# Patient Record
Sex: Female | Born: 1960 | Race: White | Hispanic: No | Smoking: Never smoker
Health system: Southern US, Community
[De-identification: ages and names within clinical notes are randomized; demographics above are authoritative.]

## PROBLEM LIST (undated history)

## (undated) DIAGNOSIS — Z789 Other specified health status: Secondary | ICD-10-CM

## (undated) DIAGNOSIS — E538 Deficiency of other specified B group vitamins: Secondary | ICD-10-CM

## (undated) DIAGNOSIS — I10 Essential (primary) hypertension: Secondary | ICD-10-CM

## (undated) DIAGNOSIS — B009 Herpesviral infection, unspecified: Secondary | ICD-10-CM

## (undated) HISTORY — DX: Herpesviral infection, unspecified: B00.9

## (undated) HISTORY — DX: Deficiency of other specified B group vitamins: E53.8

---

## 1999-04-26 ENCOUNTER — Encounter: Payer: Self-pay | Admitting: Emergency Medicine

## 1999-04-26 ENCOUNTER — Emergency Department (HOSPITAL_COMMUNITY): Admission: EM | Admit: 1999-04-26 | Discharge: 1999-04-26 | Payer: Self-pay | Admitting: Emergency Medicine

## 2001-12-09 ENCOUNTER — Other Ambulatory Visit: Admission: RE | Admit: 2001-12-09 | Discharge: 2001-12-09 | Payer: Self-pay | Admitting: Obstetrics and Gynecology

## 2002-07-29 ENCOUNTER — Emergency Department (HOSPITAL_COMMUNITY): Admission: EM | Admit: 2002-07-29 | Discharge: 2002-07-29 | Payer: Self-pay | Admitting: *Deleted

## 2003-01-20 ENCOUNTER — Other Ambulatory Visit: Admission: RE | Admit: 2003-01-20 | Discharge: 2003-01-20 | Payer: Self-pay | Admitting: Obstetrics and Gynecology

## 2004-02-27 ENCOUNTER — Other Ambulatory Visit: Admission: RE | Admit: 2004-02-27 | Discharge: 2004-02-27 | Payer: Self-pay | Admitting: Obstetrics and Gynecology

## 2005-05-09 ENCOUNTER — Other Ambulatory Visit: Admission: RE | Admit: 2005-05-09 | Discharge: 2005-05-09 | Payer: Self-pay | Admitting: Obstetrics and Gynecology

## 2005-09-28 ENCOUNTER — Emergency Department (HOSPITAL_COMMUNITY): Admission: EM | Admit: 2005-09-28 | Discharge: 2005-09-28 | Payer: Self-pay | Admitting: Emergency Medicine

## 2006-07-09 ENCOUNTER — Other Ambulatory Visit: Admission: RE | Admit: 2006-07-09 | Discharge: 2006-07-09 | Payer: Self-pay | Admitting: Obstetrics and Gynecology

## 2007-02-06 ENCOUNTER — Encounter (INDEPENDENT_AMBULATORY_CARE_PROVIDER_SITE_OTHER): Payer: Self-pay | Admitting: Specialist

## 2007-02-06 ENCOUNTER — Ambulatory Visit (HOSPITAL_COMMUNITY): Admission: RE | Admit: 2007-02-06 | Discharge: 2007-02-06 | Payer: Self-pay | Admitting: Obstetrics and Gynecology

## 2009-07-05 ENCOUNTER — Inpatient Hospital Stay (HOSPITAL_COMMUNITY): Admission: AD | Admit: 2009-07-05 | Discharge: 2009-07-05 | Payer: Self-pay | Admitting: Obstetrics and Gynecology

## 2010-03-01 ENCOUNTER — Observation Stay (HOSPITAL_COMMUNITY): Admission: EM | Admit: 2010-03-01 | Discharge: 2010-03-01 | Payer: Self-pay | Admitting: Internal Medicine

## 2011-01-11 LAB — COMPREHENSIVE METABOLIC PANEL
ALT: 13 U/L (ref 0–35)
AST: 19 U/L (ref 0–37)
Albumin: 3.4 g/dL — ABNORMAL LOW (ref 3.5–5.2)
Alkaline Phosphatase: 44 U/L (ref 39–117)
BUN: 8 mg/dL (ref 6–23)
CO2: 30 mEq/L (ref 19–32)
Calcium: 8.9 mg/dL (ref 8.4–10.5)
Chloride: 103 mEq/L (ref 96–112)
Creatinine, Ser: 0.71 mg/dL (ref 0.4–1.2)
GFR calc Af Amer: >60 mL/min (ref >60)
GFR calc non Af Amer: >60 mL/min (ref >60)
Glucose, Bld: 99 mg/dL (ref 70–99)
Potassium: 3.9 mEq/L (ref 3.5–5.1)
Sodium: 138 mEq/L (ref 135–145)
Total Bilirubin: 0.2 mg/dL — ABNORMAL LOW (ref 0.3–1.2)
Total Protein: 6.3 g/dL (ref 6.0–8.3)

## 2011-01-11 LAB — URINALYSIS, ROUTINE W REFLEX MICROSCOPIC
Bilirubin Urine: NEGATIVE
Glucose, UA: NEGATIVE mg/dL
Ketones, ur: NEGATIVE mg/dL
Leukocytes, UA: NEGATIVE
Nitrite: NEGATIVE
Protein, ur: NEGATIVE mg/dL
Specific Gravity, Urine: 1.01 (ref 1.005–1.030)
Urobilinogen, UA: 0.2 mg/dL (ref 0.0–1.0)
pH: 7.5 (ref 5.0–8.0)

## 2011-01-11 LAB — CBC
HCT: 28.5 % — ABNORMAL LOW (ref 36.0–46.0)
Hemoglobin: 9.2 g/dL — ABNORMAL LOW (ref 12.0–15.0)
MCHC: 32.3 g/dL (ref 30.0–36.0)
MCV: 75.8 fL — ABNORMAL LOW (ref 78.0–100.0)
Platelets: 313 10*3/uL (ref 150–400)
RBC: 3.76 MIL/uL — ABNORMAL LOW (ref 3.87–5.11)
RDW: 14.6 % (ref 11.5–15.5)
WBC: 6.3 10*3/uL (ref 4.0–10.5)

## 2011-01-11 LAB — URINE MICROSCOPIC-ADD ON

## 2011-02-22 NOTE — Op Note (Signed)
NAMESAKARA, LEHTINEN              ACCOUNT NO.:  [id number]   MEDICAL RECORD NO.:  [id number]          PATIENT TYPE:  AMB   LOCATION:  SDC                           FACILITY:  [facility]   PHYSICIAN:  Huel Cote, M.D. DATE OF BIRTH:  1960-11-22   DATE OF PROCEDURE:  02/06/2007  DATE OF DISCHARGE:                               OPERATIVE REPORT   PREOPERATIVE DIAGNOSES:  1. Abnormal uterine bleeding.  2. Menorrhagia.   POSTOPERATIVE DIAGNOSES:  1. Abnormal uterine bleeding.  2. Menorrhagia.  3. Submucous fibroid identified.   PROCEDURE:  1. Dilatation and curettage.  2. Operative hysteroscopy with partial resection of submucosal      fibroid.  3. Paracervical block.   SURGEON:  Huel Cote, M.D.   ANESTHESIA:  LMA and local 1% lidocaine plain.   SPECIMENS:  Endometrial curettings and fragments of fibroid were sent.   ESTIMATED BLOOD LOSS:  50 mL.   HYSTEROSCOPIC DEFICIT:  Estimated at 500 mL.   IV FLUIDS:  1200 mL LR.   PROCEDURE:  The patient was taken to the operating room, where LMA  anesthesia was obtained without difficulty.  She was then prepped and  draped in the normal sterile fashion in the dorsal lithotomy position.  A speculum was then placed within the vagina, the cervix identified and  injected with a local paracervical block at the 2 and 10 o'clock  position with 1% lidocaine to help with postoperative pain.  The cervix  was identified and the uterus sounded to approximately 8-9 cm.  The  cervix was partially dilated from her prior treatment with Cytotec and  was easily sequentially dilated with the [facility] dilators to approximately  27.  The small resectoscope was then introduced into the cervix without  difficulty and the uterine cavity inspected.  There was some adherent  blood clot which was removed, and at this point it was noted that the  endometrium itself appeared thin with a submucosal fibroid noted on the  posterior surface of the uterus.   There was no polyp identified and,  again, endometrium did not appear especially thick.  The nature of the  fibroid on the posterior wall was very large, flat, broad-based fibroid,  which was a little difficult to identify the base of.  Therefore, a very  conservative resection was performed with the single loop cautery and  just the areas that were clearly above the endometrium were resected  that was located at the junction of the cervix and the fundus itself;  therefore, attention was made to be very conservative  in the passes.  Of note, the hysteroscopic deficit was estimated at 500 mL.  Prior to  the procedure the equipment was not hooked up correctly and  approximately 140 mL were lost on the floor.  When this was subtracted  from the deficit, which at the conclusion of the procedure read 875 in  addition to another at least 100 mL on the towels on the floor, it was  estimated that the hysteroscopic deficit could not have exceeded 500 mL,  and there was no evidence at any point of  any perforation with the  uterus nicely inflated.  The deficit, as stated, was approximately 875  at the conclusion of the procedure and given this rising deficit and  difficulty in visualizing the base of the fibroid, it was determined to  discontinue any further resection.  The hysteroscope was removed and the  cervix examined.  When the tenaculum was removed, one area was treated  with silver nitrate for good result and at this point there was no  significant active bleeding noted.  The patient was then awakened and  taken to the recovery room in stable condition.      Huel Cote, M.D.  Electronically Signed     KR/MEDQ  D:  02/06/2007  T:  02/06/2007  Job:  [id number]

## 2011-02-22 NOTE — H&P (Signed)
Brooke Wu, CASE              ACCOUNT NO.:  [id number]   MEDICAL RECORD NO.:  [id number]          PATIENT TYPE:  AMB   LOCATION:  SDC                           FACILITY:  [facility]   PHYSICIAN:  Huel Cote, M.D. DATE OF BIRTH:  09-29-1961   DATE OF ADMISSION:  02/06/2007  DATE OF DISCHARGE:                              HISTORY & PHYSICAL   PREOPERATIVE HISTORY AND PHYSICAL   The patient is a 50 year old, G2, P2, who is coming in for a scheduled  hysteroscopy, a possible polypectomy, and possible resection of a  submucosal fibroid due to a problem with abnormal uterine bleeding with  a cycle that went on for approximately eight weeks with some heavy days  with large, large clots passed.  Patient had been placed on a Dose-Pak  Femcon and has had some slowing of her bleeding.  She had a saline  infusion ultrasound performed, which revealed a possible polyp as well  as several small fibroids in the uterus.  Although her bleeding had  improved with Femcon, given her abnormal bleeding it was felt that it  would be most prudent to perform endometrial sampling and possible  polypectomy simultaneously in the OR with hysteroscopy guidance, and the  patient is agreeable to this plan.   PAST MEDICAL HISTORY:  Significant for migraines.   PAST SURGICAL HISTORY:  None.   PAST OBSTETRIC HISTORY:  Two vaginal deliveries.   PAST GYNECOLOGIC HISTORY:  No abnormal Pap smears.   There is no breast cancer or colon cancer in the family.   CURRENT MEDICATIONS:  1. Femcon oral contraceptives 50 mg.  2. Xanax p.r.n.   PHYSICAL EXAMINATION:  VITAL SIGNS:  Her weight is 118 pounds, blood  pressure 110/70.  CARDIAC:  Regular rate and rhythm.  LUNGS:  Clear.  ABDOMEN:  Soft and nontender.  PELVIC EXAM:  She has a normal size uterus, and her adnexa have no  palpable masses.   We discussed multiple possibilities including conservative management  with an endometrial biopsy, however, given the  possible presence of a  polyp, I have suggested that the patient proceed with hysteroscopy to  remove this polyp and correct her bleeding problems and also ensure no  endometrial atypia is present.  The risks and benefits were discussed in  detail including bleeding and infection and possible uterine  perforation.  The patient understands all  these risks and desires to proceed with the surgery as stated.  She will  utilize Cytotec 400 mcg in the vagina three to four hours prior to  procedure, and we will proceed as planned at 7:30 a.m. at [facility] facility on May 2nd.      Huel Cote, M.D.  Electronically Signed     KR/MEDQ  D:  02/05/2007  T:  02/05/2007  Job:  [id number]

## 2012-10-31 ENCOUNTER — Emergency Department (HOSPITAL_COMMUNITY)
Admission: EM | Admit: 2012-10-31 | Discharge: 2012-11-01 | Disposition: A | Discharge status: Home / Self Care | Payer: BC Managed Care – PPO | Attending: Emergency Medicine | Admitting: Emergency Medicine

## 2012-10-31 DIAGNOSIS — I10 Essential (primary) hypertension: Secondary | ICD-10-CM | POA: Insufficient documentation

## 2012-10-31 DIAGNOSIS — X500XXA Overexertion from strenuous movement or load, initial encounter: Secondary | ICD-10-CM | POA: Insufficient documentation

## 2012-10-31 DIAGNOSIS — Y929 Unspecified place or not applicable: Secondary | ICD-10-CM | POA: Insufficient documentation

## 2012-10-31 DIAGNOSIS — S82853A Displaced trimalleolar fracture of unspecified lower leg, initial encounter for closed fracture: Secondary | ICD-10-CM

## 2012-10-31 DIAGNOSIS — Z79899 Other long term (current) drug therapy: Secondary | ICD-10-CM | POA: Insufficient documentation

## 2012-10-31 DIAGNOSIS — Y9389 Activity, other specified: Secondary | ICD-10-CM | POA: Insufficient documentation

## 2012-10-31 HISTORY — DX: Essential (primary) hypertension: I10

## 2012-10-31 MED ORDER — OXYCODONE-ACETAMINOPHEN 5-325 MG PO TABS
1.0000 | ORAL_TABLET | Freq: Once | ORAL | Status: AC
  Administered 2012-11-01: 1 via ORAL
  Filled 2012-10-31: qty 1

## 2012-10-31 NOTE — ED Notes (Signed)
Pt says she was going down the steps, missed the last step, injuring her ankle. She says she heard a popping in her left ankle.

## 2012-10-31 NOTE — ED Provider Notes (Signed)
History   This chart was scribed for non-physician practitioner working with Brooke Nielsen, MD by Smitty Pluck. This patient was seen in room WTR7/WTR7 and the patient's care was started at 11:55 PM.   CSN: [id number]  Arrival date & time 10/31/12  2343    Chief Complaint  Patient presents with  . Ankle Pain     Patient is a 52 y.o. female presenting with ankle pain. The history is provided by the patient. No language interpreter was used.  Ankle Pain  Pertinent negatives include no numbness.   Brooke Wu is a 52 y.o. female who presents to the Emergency Department complaining of constant, moderate left ankle pain onset today. Pt reports that she was walking down steps and missed the last step causing her to roll her ankle. Patient is nonweightbearing secondary to pain. She has had single call drink tonight. Pt denies numbness, fever, nausea, vomiting, diarrhea, cough, SOB and any other pain.   No past medical history on file.  No past surgical history on file.  No family history on file.  History  Substance Use Topics  . Smoking status: Not on file  . Smokeless tobacco: Not on file  . Alcohol Use: Not on file    OB History    No data available      Review of Systems  Constitutional: Negative for fever.  Respiratory: Negative for shortness of breath.   Cardiovascular: Negative for chest pain.  Gastrointestinal: Negative for nausea, vomiting, abdominal pain and diarrhea.  Musculoskeletal: Positive for arthralgias.  Neurological: Negative for numbness.  All other systems reviewed and are negative.    Allergies  Review of patient's allergies indicates not on file.  Home Medications  No current outpatient prescriptions on file.  BP 166/81  Pulse 73  Temp 98.9 F (37.2 C)  Resp 16  SpO2 99%  Physical Exam  Nursing note and vitals reviewed. Constitutional: She is oriented to person, place, and time. She appears well-developed and well-nourished. No  distress.  HENT:  Head: Normocephalic.  Eyes: Conjunctivae normal and EOM are normal.  Cardiovascular: Normal rate.   Pulmonary/Chest: Effort normal. No stridor.  Musculoskeletal:       Mild tenderness to palpation of distal tibia  Trace swelling   DP +2 bilaterally Distal sensation intact  Patient is not able to weight-bear  Neurological: She is alert and oriented to person, place, and time.  Psychiatric: She has a normal mood and affect.    ED Course  Procedures (including critical care time) DIAGNOSTIC STUDIES: Oxygen Saturation is 99% on room air, normal by my interpretation.    COORDINATION OF CARE: 12:01 AM Discussed ED treatment with pt and pt agrees.     Labs Reviewed - No data to display Dg Ankle Complete Left  11/01/2012  *RADIOLOGY REPORT*  Clinical Data: Status post fall down steps; lateral ankle swelling and bruising.  Associated lateral and posterior ankle pain.  LEFT ANKLE COMPLETE - 3+ VIEW  Comparison: None.  Findings: There is a mildly displaced horizontal medial malleolar fracture, as well as a posteriorly displaced slightly comminuted oblique fracture through the distal fibula.  There is also a slightly comminuted posterior displaced fracture involving the posterior malleolus.  There is mild inferior and posterior subluxation of the talus.  The ankle mortise is difficult to assess given expansion of the joint space.  The interosseous space appears grossly intact.  The joint spaces are otherwise preserved.  Diffuse soft tissue swelling is noted about the  ankle.  IMPRESSION: Trimalleolar fracture noted, with slight comminution and posterior displacement at the distal fibular and posterior malleolar fractures.  The distal fibular fracture is oblique in nature.  Mild inferior and posterior subluxation of the talus.   Original Report Authenticated By: Tonia Ghent, M.D.      1. Trimalleolar fracture       MDM  trimalleolar fracture with Posteriorly displaced  fracture of the distal fibula.  Orthopedic consult from Dr. Shelle Iron appreciated: He is evaluated the x-ray and has advised [place] to attempt reduction and placed in a posterior splint and U-splint. Technique advised is one hand on front of tibia and pulled forward he states he subluxation should resolve spontaneously with this pressure.   Waiting for ortho tech to arrive before attempting reduction by Dr. Dierdre Highman.   New Prescriptions   OXYCODONE-ACETAMINOPHEN (PERCOCET/ROXICET) 5-325 MG PER TABLET    1 to 2 tabs PO q6hrs  PRN for pain    I personally performed the services described in this documentation, which was scribed in my presence. The recorded information has been reviewed and is accurate.   Wynetta Emery, PA-C 11/01/12 [phone]

## 2012-11-01 ENCOUNTER — Emergency Department (HOSPITAL_COMMUNITY): Payer: BC Managed Care – PPO

## 2012-11-01 ENCOUNTER — Encounter (HOSPITAL_COMMUNITY): Payer: Self-pay | Admitting: *Deleted

## 2012-11-01 MED ORDER — HYDROMORPHONE HCL PF 1 MG/ML IJ SOLN
1.0000 mg | Freq: Once | INTRAMUSCULAR | Status: AC
  Administered 2012-11-01: 1 mg via INTRAVENOUS
  Filled 2012-11-01: qty 1

## 2012-11-01 MED ORDER — ONDANSETRON HCL 4 MG/2ML IJ SOLN
4.0000 mg | Freq: Once | INTRAMUSCULAR | Status: AC
  Administered 2012-11-01: 4 mg via INTRAVENOUS
  Filled 2012-11-01: qty 2

## 2012-11-01 MED ORDER — SODIUM CHLORIDE 0.9 % IV BOLUS (SEPSIS)
1000.0000 mL | Freq: Once | INTRAVENOUS | Status: AC
  Administered 2012-11-01: 1000 mL via INTRAVENOUS

## 2012-11-01 MED ORDER — OXYCODONE-ACETAMINOPHEN 5-325 MG PO TABS: ORAL_TABLET | ORAL | Status: DC

## 2012-11-01 NOTE — ED Provider Notes (Signed)
Medical screening examination/treatment/procedure(s) were conducted as a shared visit with non-physician practitioner(s) and myself.  I personally evaluated the patient during the encounter  L ankle pain deformity and TTP with distal N/V intact. Xray reviewed. I assisted ortho tech in splinting s/p reduction. Post reduction films obtained. Plan crutches, NWB, f/u Ortho Dr Shelle Iron.   3:41 AM PROCEDURE - Splinting. Consent obtained, time out performed. IV Dilaudid.  Traction applied.  splinting performed.  PT tolerated well, post procedure films obtained. PT tolerated well.   [name], MD 11/01/12 [phone]

## 2012-11-01 NOTE — ED Notes (Addendum)
PA at bedside to discuss plan of care

## 2012-11-01 NOTE — ED Notes (Signed)
Patient transported to X-ray 

## 2012-11-03 ENCOUNTER — Encounter (HOSPITAL_BASED_OUTPATIENT_CLINIC_OR_DEPARTMENT_OTHER): Payer: Self-pay | Admitting: *Deleted

## 2012-11-03 NOTE — Progress Notes (Signed)
To come in for bmet-called dr Oneta Rack for ekg and notes Has several body piercing-the one in tongue she will put a plastic filler in.

## 2012-11-04 ENCOUNTER — Other Ambulatory Visit: Payer: Self-pay | Admitting: Orthopedic Surgery

## 2012-11-05 ENCOUNTER — Ambulatory Visit (HOSPITAL_BASED_OUTPATIENT_CLINIC_OR_DEPARTMENT_OTHER)
Admission: RE | Admit: 2012-11-05 | Discharge: 2012-11-05 | Disposition: A | Discharge status: Home / Self Care | Payer: BC Managed Care – PPO | Source: Ambulatory Visit | Attending: Orthopedic Surgery | Admitting: Orthopedic Surgery

## 2012-11-05 ENCOUNTER — Ambulatory Visit (HOSPITAL_BASED_OUTPATIENT_CLINIC_OR_DEPARTMENT_OTHER): Payer: BC Managed Care – PPO | Admitting: Anesthesiology

## 2012-11-05 ENCOUNTER — Encounter (HOSPITAL_BASED_OUTPATIENT_CLINIC_OR_DEPARTMENT_OTHER)
Admission: RE | Disposition: A | Discharge status: Home / Self Care | Payer: Self-pay | Source: Ambulatory Visit | Attending: Orthopedic Surgery

## 2012-11-05 ENCOUNTER — Encounter (HOSPITAL_BASED_OUTPATIENT_CLINIC_OR_DEPARTMENT_OTHER): Payer: Self-pay | Admitting: Anesthesiology

## 2012-11-05 ENCOUNTER — Encounter (HOSPITAL_BASED_OUTPATIENT_CLINIC_OR_DEPARTMENT_OTHER): Payer: Self-pay | Admitting: *Deleted

## 2012-11-05 ENCOUNTER — Ambulatory Visit (HOSPITAL_COMMUNITY): Payer: BC Managed Care – PPO

## 2012-11-05 DIAGNOSIS — S82852A Displaced trimalleolar fracture of left lower leg, initial encounter for closed fracture: Secondary | ICD-10-CM

## 2012-11-05 DIAGNOSIS — I10 Essential (primary) hypertension: Secondary | ICD-10-CM | POA: Insufficient documentation

## 2012-11-05 DIAGNOSIS — X500XXA Overexertion from strenuous movement or load, initial encounter: Secondary | ICD-10-CM | POA: Insufficient documentation

## 2012-11-05 DIAGNOSIS — S82853A Displaced trimalleolar fracture of unspecified lower leg, initial encounter for closed fracture: Secondary | ICD-10-CM | POA: Insufficient documentation

## 2012-11-05 HISTORY — PX: ORIF ANKLE FRACTURE: SHX5408

## 2012-11-05 HISTORY — DX: Other specified health status: Z78.9

## 2012-11-05 LAB — POCT I-STAT, CHEM 8
HCT: 41 % (ref 36.0–46.0)
Hemoglobin: 13.9 g/dL (ref 12.0–15.0)
Potassium: 3.7 mEq/L (ref 3.5–5.1)
Sodium: 139 mEq/L (ref 135–145)
TCO2: 26 mmol/L (ref 0–100)

## 2012-11-05 LAB — POCT HEMOGLOBIN-HEMACUE: Hemoglobin: 14.5 g/dL (ref 12.0–15.0)

## 2012-11-05 SURGERY — OPEN REDUCTION INTERNAL FIXATION (ORIF) ANKLE FRACTURE
Anesthesia: Regional | Site: Ankle | Laterality: Left | Wound class: Clean

## 2012-11-05 MED ORDER — FENTANYL CITRATE 0.05 MG/ML IJ SOLN
50.0000 ug | INTRAMUSCULAR | Status: DC | PRN
  Administered 2012-11-05: 100 ug via INTRAVENOUS

## 2012-11-05 MED ORDER — HYDROMORPHONE HCL PF 1 MG/ML IJ SOLN
0.2500 mg | INTRAMUSCULAR | Status: DC | PRN
  Administered 2012-11-05 (×4): 0.5 mg via INTRAVENOUS

## 2012-11-05 MED ORDER — OXYCODONE HCL 5 MG PO TABS
5.0000 mg | ORAL_TABLET | ORAL | Status: DC | PRN
Start: 2012-11-05 — End: 2013-09-16

## 2012-11-05 MED ORDER — SUCCINYLCHOLINE CHLORIDE 20 MG/ML IJ SOLN
INTRAMUSCULAR | Status: DC | PRN
  Administered 2012-11-05: 80 mg via INTRAVENOUS

## 2012-11-05 MED ORDER — PROPOFOL 10 MG/ML IV BOLUS
INTRAVENOUS | Status: DC | PRN
  Administered 2012-11-05 (×2): 200 mg via INTRAVENOUS
  Administered 2012-11-05: 50 mg via INTRAVENOUS

## 2012-11-05 MED ORDER — MIDAZOLAM HCL 2 MG/2ML IJ SOLN: 1.0000 mg | INTRAMUSCULAR | Status: DC | PRN

## 2012-11-05 MED ORDER — FENTANYL CITRATE 0.05 MG/ML IJ SOLN: 50.0000 ug | INTRAMUSCULAR | Status: DC | PRN

## 2012-11-05 MED ORDER — DEXAMETHASONE SODIUM PHOSPHATE 4 MG/ML IJ SOLN
INTRAMUSCULAR | Status: DC | PRN
  Administered 2012-11-05: 10 mg via INTRAVENOUS

## 2012-11-05 MED ORDER — FENTANYL CITRATE 0.05 MG/ML IJ SOLN
INTRAMUSCULAR | Status: DC | PRN
  Administered 2012-11-05 (×2): 50 ug via INTRAVENOUS

## 2012-11-05 MED ORDER — CEFAZOLIN SODIUM-DEXTROSE 2-3 GM-% IV SOLR
2.0000 g | INTRAVENOUS | Status: AC
  Administered 2012-11-05: 2 g via INTRAVENOUS

## 2012-11-05 MED ORDER — LIDOCAINE HCL (CARDIAC) 20 MG/ML IV SOLN
INTRAVENOUS | Status: DC | PRN
  Administered 2012-11-05: 75 mg via INTRAVENOUS

## 2012-11-05 MED ORDER — MIDAZOLAM HCL 2 MG/2ML IJ SOLN
1.0000 mg | INTRAMUSCULAR | Status: DC | PRN
  Administered 2012-11-05: 2 mg via INTRAVENOUS

## 2012-11-05 MED ORDER — LACTATED RINGERS IV SOLN
INTRAVENOUS | Status: DC
  Administered 2012-11-05 (×2): via INTRAVENOUS

## 2012-11-05 MED ORDER — SODIUM CHLORIDE 0.9 % IV SOLN: INTRAVENOUS | Status: DC

## 2012-11-05 MED ORDER — EPHEDRINE SULFATE 50 MG/ML IJ SOLN
INTRAMUSCULAR | Status: DC | PRN
  Administered 2012-11-05: 5 mg via INTRAVENOUS
  Administered 2012-11-05: 10 mg via INTRAVENOUS

## 2012-11-05 MED ORDER — LACTATED RINGERS IV SOLN
INTRAVENOUS | Status: DC
  Administered 2012-11-05: 09:00:00 via INTRAVENOUS

## 2012-11-05 MED ORDER — CHLORHEXIDINE GLUCONATE 4 % EX LIQD: 60.0000 mL | Freq: Once | CUTANEOUS | Status: DC

## 2012-11-05 MED ORDER — OXYCODONE HCL 5 MG PO TABS
5.0000 mg | ORAL_TABLET | Freq: Once | ORAL | Status: AC
  Administered 2012-11-05: 5 mg via ORAL

## 2012-11-05 SURGICAL SUPPLY — 80 items
BANDAGE ESMARK 6X9 LF (GAUZE/BANDAGES/DRESSINGS) ×1 IMPLANT
BIT DRILL 2.5X2.75 QC CALB (BIT) ×1 IMPLANT
BIT DRILL 3.5X5.5 QC CALB (BIT) ×1 IMPLANT
BLADE SURG 15 STRL LF DISP TIS (BLADE) ×2 IMPLANT
BLADE SURG 15 STRL SS (BLADE) ×4
BNDG CMPR 9X4 STRL LF SNTH (GAUZE/BANDAGES/DRESSINGS)
BNDG CMPR 9X6 STRL LF SNTH (GAUZE/BANDAGES/DRESSINGS) ×1
BNDG COHESIVE 4X5 TAN STRL (GAUZE/BANDAGES/DRESSINGS) ×2 IMPLANT
BNDG COHESIVE 6X5 TAN STRL LF (GAUZE/BANDAGES/DRESSINGS) ×2 IMPLANT
BNDG ESMARK 4X9 LF (GAUZE/BANDAGES/DRESSINGS) IMPLANT
BNDG ESMARK 6X9 LF (GAUZE/BANDAGES/DRESSINGS) ×2
CHLORAPREP W/TINT 26ML (MISCELLANEOUS) ×2 IMPLANT
CLOTH BEACON ORANGE TIMEOUT ST (SAFETY) ×2 IMPLANT
COVER TABLE BACK 60X90 (DRAPES) ×2 IMPLANT
CUFF TOURNIQUET SINGLE 34IN LL (TOURNIQUET CUFF) ×2 IMPLANT
DECANTER SPIKE VIAL GLASS SM (MISCELLANEOUS) IMPLANT
DRAPE C-ARM 42X72 X-RAY (DRAPES) ×2 IMPLANT
DRAPE C-ARMOR (DRAPES) ×1 IMPLANT
DRAPE EXTREMITY T 121X128X90 (DRAPE) ×2 IMPLANT
DRAPE INCISE IOBAN 66X45 STRL (DRAPES) IMPLANT
DRAPE U-SHAPE 47X51 STRL (DRAPES) ×2 IMPLANT
DRAPE U-SHAPE 76X120 STRL (DRAPES) IMPLANT
DRSG ADAPTIC 3X8 NADH LF (GAUZE/BANDAGES/DRESSINGS) IMPLANT
DRSG EMULSION OIL 3X3 NADH (GAUZE/BANDAGES/DRESSINGS) ×2 IMPLANT
DRSG PAD ABDOMINAL 8X10 ST (GAUZE/BANDAGES/DRESSINGS) ×4 IMPLANT
ELECT REM PT RETURN 9FT ADLT (ELECTROSURGICAL) ×2
ELECTRODE REM PT RTRN 9FT ADLT (ELECTROSURGICAL) ×1 IMPLANT
GLOVE BIO SURGEON STRL SZ 6.5 (GLOVE) ×1 IMPLANT
GLOVE BIO SURGEON STRL SZ8 (GLOVE) ×2 IMPLANT
GLOVE BIOGEL PI IND STRL 8 (GLOVE) ×1 IMPLANT
GLOVE BIOGEL PI INDICATOR 8 (GLOVE) ×1
GOWN PREVENTION PLUS XLARGE (GOWN DISPOSABLE) ×2 IMPLANT
GOWN PREVENTION PLUS XXLARGE (GOWN DISPOSABLE) ×2 IMPLANT
K-WIRE ACE 1.6X6 (WIRE) ×2
KWIRE ACE 1.6X6 (WIRE) IMPLANT
NEEDLE HYPO 22GX1.5 SAFETY (NEEDLE) IMPLANT
NS IRRIG 1000ML POUR BTL (IV SOLUTION) ×2 IMPLANT
PACK BASIN DAY SURGERY FS (CUSTOM PROCEDURE TRAY) ×2 IMPLANT
PAD CAST 4YDX4 CTTN HI CHSV (CAST SUPPLIES) ×1 IMPLANT
PADDING CAST ABS 4INX4YD NS (CAST SUPPLIES)
PADDING CAST ABS COTTON 4X4 ST (CAST SUPPLIES) IMPLANT
PADDING CAST COTTON 4X4 STRL (CAST SUPPLIES) ×2
PADDING CAST COTTON 6X4 STRL (CAST SUPPLIES) ×2 IMPLANT
PENCIL BUTTON HOLSTER BLD 10FT (ELECTRODE) ×2 IMPLANT
PLATE ACE 100DEG 7HOLE (Plate) ×1 IMPLANT
SCREW ACE CAN 4.0 36M (Screw) ×1 IMPLANT
SCREW ACE CAN 4.0 48M (Screw) ×1 IMPLANT
SCREW ACE CAN 4.0 50M (Screw) ×1 IMPLANT
SCREW CANN ACE 4.0X32 (Screw) ×1 IMPLANT
SCREW CORTICAL 3.5MM  12MM (Screw) ×3 IMPLANT
SCREW CORTICAL 3.5MM  16MM (Screw) ×1 IMPLANT
SCREW CORTICAL 3.5MM 12MM (Screw) IMPLANT
SCREW CORTICAL 3.5MM 14MM (Screw) ×1 IMPLANT
SCREW CORTICAL 3.5MM 16MM (Screw) IMPLANT
SCREW CORTICAL 3.5MM 18MM (Screw) ×2 IMPLANT
SCREW CORTICAL 3.5MM 22MM (Screw) ×1 IMPLANT
SCREW CORTICAL 3.5MM 26MM (Screw) ×1 IMPLANT
SHEET MEDIUM DRAPE 40X70 STRL (DRAPES) ×2 IMPLANT
SLEEVE SCD COMPRESS KNEE MED (MISCELLANEOUS) ×2 IMPLANT
SPLINT FAST PLASTER 5X30 (CAST SUPPLIES) ×20
SPLINT PLASTER CAST FAST 5X30 (CAST SUPPLIES) ×20 IMPLANT
SPONGE GAUZE 4X4 12PLY (GAUZE/BANDAGES/DRESSINGS) ×2 IMPLANT
SPONGE LAP 18X18 X RAY DECT (DISPOSABLE) ×2 IMPLANT
STOCKINETTE 6  STRL (DRAPES) ×1
STOCKINETTE 6 STRL (DRAPES) ×1 IMPLANT
SUCTION FRAZIER TIP 10 FR DISP (SUCTIONS) ×2 IMPLANT
SUT ETHILON 3 0 PS 1 (SUTURE) ×2 IMPLANT
SUT FIBERWIRE #2 38 T-5 BLUE (SUTURE)
SUT MNCRL AB 3-0 PS2 18 (SUTURE) ×2 IMPLANT
SUT VIC AB 0 SH 27 (SUTURE) IMPLANT
SUT VIC AB 2-0 SH 27 (SUTURE) ×2
SUT VIC AB 2-0 SH 27XBRD (SUTURE) IMPLANT
SUT VICRYL 4-0 PS2 18IN ABS (SUTURE) IMPLANT
SUTURE FIBERWR #2 38 T-5 BLUE (SUTURE) IMPLANT
SYR BULB 3OZ (MISCELLANEOUS) ×2 IMPLANT
SYR CONTROL 10ML LL (SYRINGE) IMPLANT
TUBE CONNECTING 20X1/4 (TUBING) IMPLANT
UNDERPAD 30X30 INCONTINENT (UNDERPADS AND DIAPERS) ×2 IMPLANT
WASHER FLAT ACE (Orthopedic Implant) ×1 IMPLANT
WASHER PLAIN FLAT ACE NS 3PK (Orthopedic Implant) IMPLANT

## 2012-11-05 NOTE — Progress Notes (Signed)
Assisted Dr. Edwards with left, ultrasound guided, popliteal block. Side rails up, monitors on throughout procedure. See vital signs in flow sheet. Tolerated Procedure well. 

## 2012-11-05 NOTE — Anesthesia Postprocedure Evaluation (Signed)
  Anesthesia Post-op Note  Patient: Brooke Wu  Procedure(s) Performed: Procedure(s) (LRB) with comments: OPEN REDUCTION INTERNAL FIXATION (ORIF) ANKLE FRACTURE (Left) - open reduction internal fixation LEFT ANKLE TRIMALLEOLAR FRACTURE   Patient Location: PACU  Anesthesia Type:General  Level of Consciousness: awake  Airway and Oxygen Therapy: Patient Spontanous Breathing  Post-op Pain: mild  Post-op Assessment: Post-op Vital signs reviewed  Post-op Vital Signs: Reviewed  Complications: No apparent anesthesia complications

## 2012-11-05 NOTE — Anesthesia Procedure Notes (Addendum)
Procedure Name: Intubation Date/Time: 11/05/2012 10:48 AM Performed by: Gar Gibbon Pre-anesthesia Checklist: Patient identified, Emergency Drugs available, Suction available and Patient being monitored Patient Re-evaluated:Patient Re-evaluated prior to inductionOxygen Delivery Method: Circle System Utilized Preoxygenation: Pre-oxygenation with 100% oxygen Intubation Type: IV induction Ventilation: Mask ventilation without difficulty Laryngoscope Size: Mac and 3 Grade View: Grade I Tube type: Oral Number of attempts: 1 Airway Equipment and Method: stylet and oral airway Placement Confirmation: ETT inserted through vocal cords under direct vision,  positive ETCO2 and breath sounds checked- equal and bilateral Secured at: 22 cm Tube secured with: Tape Dental Injury: Teeth and Oropharynx as per pre-operative assessment    Anesthesia Regional Block:  Popliteal block  Pre-Anesthetic Checklist: ,, timeout performed, Correct Patient, Correct Site, Correct Laterality, Correct Procedure, Correct Position, site marked, Risks and benefits discussed,  Surgical consent,  Pre-op evaluation,  At surgeon's request and post-op pain management  Laterality: Left  Prep: chloraprep       Needles:  Injection technique: Single-shot  Needle Type: Echogenic Needle          Additional Needles:  Procedures: Doppler guided Popliteal block  Nerve Stimulator or Paresthesia:  Response: 0.5 mA,   Additional Responses:   Narrative:  Start time: 11/05/2012 9:30 AM End time: 11/05/2012 9:45 AM Injection made incrementally with aspirations every 5 mL.  Performed by: Personally  Anesthesiologist: Dr. Randa Evens  Popliteal block

## 2012-11-05 NOTE — Op Note (Signed)
Brooke Wu, Brooke Wu              ACCOUNT NO.:  [id number]  MEDICAL RECORD NO.:  [id number]  LOCATION:                                 FACILITY:  PHYSICIAN:  Toni Arthurs, MD        DATE OF BIRTH:  04-25-1961  DATE OF PROCEDURE:  11/05/2012 DATE OF DISCHARGE:                              OPERATIVE REPORT   PREOPERATIVE DIAGNOSIS:  Left ankle trimalleolar fracture.  POSTOPERATIVE DIAGNOSIS:  Left ankle trimalleolar fracture.  PROCEDURE: 1. Open reduction and internal fixation of the left ankle trimalleolar     fracture with fixation of the posterior malleolus. 2. Intraoperative interpretation of fluoroscopic imaging. 3. Stress examination of left ankle under anesthesia.  SURGEON:  Toni Arthurs, MD  ANESTHESIA:  General, regional.  ESTIMATED BLOOD LOSS:  Minimal.  TOURNIQUET TIME:  83 minutes at 250 mmHg.  COMPLICATIONS:  None.  APPARENT DISPOSITION:  Extubated awake and stable to recovery.  INDICATION FOR PROCEDURE:  The patient is a 52 year old woman who sustained a left ankle injury earlier this week.  She presents now for operative treatment of this displaced left ankle trimalleolar fracture. She understands the risks and benefits, the alternative treatment options and elects surgical treatment.  She specifically understands risks of bleeding, infection, nerve damage, blood clots, need for additional surgery, amputation, and death.  PROCEDURE IN DETAIL:  After preoperative consent was obtained, the correct operative site was identified.  The patient was brought to the operating room and placed supine on the operating table.  General anesthesia was induced.  Preoperative antibiotics were administered. Surgical time-out was taken.  The patient was then turned into the lateral decubitus position with the left side up.  The left lower extremity was prepped and draped in standard sterile fashion with a tourniquet around the thigh.  A curvilinear incision was marked on  the skin between the peroneal tendons and the Achilles tendon.  The extremity was exsanguinated, and the tourniquet was inflated to 250 mmHg.  Sharp dissection was carried down through the marked incision at the level of peroneal tendon sheath.  The interval between the peroneus longus and the flexor hallucis longus was identified.  The posterior malleolus fracture was identified through this interval.  The fracture was reduced.  A K-wire was inserted from the posterior malleolus across the fracture site into the anterior aspect of the tibia.  The lateral fluoroscopic image confirmed appropriate position of the guide pin and appropriate reduction of the posterior malleolus fracture.  A partially threaded and cannulated 4-mm screw was inserted over the guidepin and advanced.  It was noted to have appropriate purchase at the posterior malleolus fragment.  Due to the size of the fragment, the decision was made to place a buttress against proximal migration of this fragment.  A second K-wire was inserted at the apex of the fracture in the distal aspect of the metaphyseal bone.  A lateral fluoroscopic image confirmed appropriate position of the K-wire.  A partially threaded 4-0 cannulated screw with a washer was then inserted over the K-wire and was tightened. It was noted to have appropriate purchase and buttressed the apex of the fracture site appropriately.  Both K-wires were  removed.  The lateral malleolus fracture was then identified and subperiosteal corrected and submuscular dissection was carried to expose this fracture laterally. The fracture was reduced and clamped with a lobster claw proximally and a tenaculum distally.  A fluoroscopic imaging confirmed appropriate reduction of the fracture.  Two 3.5-mm fully-threaded screws were inserted in lag fashion from posterior to anterior across the long vertical split.  Both were noted to have excellent purchase.  A 7-hole 1/3 tubular plate  was then contoured to fit the lateral aspect of the malleolus and it was fixed distally with 3 unicortical screws and proximally with 3 bicortical screws.  AP mortise and lateral views confirmed appropriate reduction of the fibular fracture and appropriate position and length of the hardware.  The mortise view was then obtained.  Under live fluoroscopy, dorsiflexion and external rotation stress was applied.  There was no widening of the ankle mortise or the syndesmosis.  Attention was then turned to the medial malleolus.  A K-wire was inserted through the tip of the medial malleolus and across the fracture site into the distal metaphyseal bone of the tibia.  The AP, mortise, and lateral views confirmed appropriate reduction of the medial malleolus fragment.  A 50 mm x 4 mm partially threaded cannulated screw was inserted over the guidepin and was noted to have excellent purchase of the tip of the malleolus.  The medial malleolar fragment was too small to hold the second screws so the first was left there in isolation.  All the wounds were irrigated copiously.  The final AP, mortise, and lateral views confirmed appropriate position and length of all hardware and appropriate reduction of the 3 fracture sites.  The lateral wound was closed with horizontal mattress sutures of 3-0 Monocryl at the level of the subcutaneous tissue and a running 3-0 nylon was used to close the skin incision.  Medial incision was closed with a horizontal mattress suture of 3-0 nylon.  Sterile dressings were applied followed by a well-padded short-leg splint.  Tourniquet was released at 83 minutes after application of the dressings.  The patient was then awakened from anesthesia and transported to the recovery room in stable condition.  FOLLOWUP PLAN:  The patient will be nonweightbearing on the left lower extremity.  She will follow up with me in 2 weeks for suture removal and conversion to a  cast.     Toni Arthurs, MD     JH/MEDQ  D:  11/05/2012  T:  11/05/2012  Job:  [id number]

## 2012-11-05 NOTE — H&P (Signed)
Brooke Wu is an 52 y.o. female.   Chief Complaint: left ankle fracture HPI: 52 y/o female with left ankle trimal fracture.  She presents now for ORIF.  Past Medical History  Diagnosis Date  . Hypertension   . Multiple body piercings     one in tongue-will take out for surgery-puts a plastic piece in    History reviewed. No pertinent past surgical history.  History reviewed. No pertinent family history. Social History:  reports that she has never smoked. She does not have any smokeless tobacco history on file. She reports that she drinks alcohol. She reports that she does not use illicit drugs.  Allergies:  Allergies  Allergen Reactions  . Penicillins Rash  . Sulfa Antibiotics Rash    Medications Prior to Admission  Medication Sig Dispense Refill  . ALPRAZolam (XANAX) 0.25 MG tablet Take 0.25 mg by mouth at bedtime as needed. For anxiety or insomnia      . bisoprolol-hydrochlorothiazide (ZIAC) 10-6.25 MG per tablet Take 1 tablet by mouth every morning.      . Norethindrone Acet-Ethinyl Est (MICROGESTIN 1.5/30 PO) Take 1 tablet by mouth every morning.      Marland Kitchen oxyCODONE-acetaminophen (PERCOCET/ROXICET) 5-325 MG per tablet 1 to 2 tabs PO q6hrs  PRN for pain  15 tablet  0  . Sertraline HCl (ZOLOFT PO) Take 1 tablet by mouth every morning.      . TRAZODONE HCL PO Take 1 tablet by mouth at bedtime.        Results for orders placed during the hospital encounter of 11/05/12 (from the past 48 hour(s))  POCT HEMOGLOBIN-HEMACUE     Status: Normal   Collection Time   11/05/12  9:01 AM      Component Value Range Comment   Hemoglobin 14.5  12.0 - 15.0 g/dL   POCT I-STAT, CHEM 8     Status: Abnormal   Collection Time   11/05/12  9:30 AM      Component Value Range Comment   Sodium 139  135 - 145 mEq/L    Potassium 3.7  3.5 - 5.1 mEq/L    Chloride 104  96 - 112 mEq/L    BUN 15  6 - 23 mg/dL    Creatinine, Ser 1.61  0.50 - 1.10 mg/dL    Glucose, Bld 89  70 - 99 mg/dL    Calcium, Ion  0.96 (*) 1.12 - 1.23 mmol/L    TCO2 26  0 - 100 mmol/L    Hemoglobin 13.9  12.0 - 15.0 g/dL    HCT 04.5  40.9 - 81.1 %    No results found.  ROS  No recent f/c/n/v/wt loss.  Blood pressure 131/67, pulse 74, temperature 97.5 F (36.4 C), temperature source Oral, resp. rate 15, height 5\' 7"  (1.702 m), weight 61.236 kg (135 lb), SpO2 100.00%. Physical Exam  [age] wd female in nad.  A and Ox 4.  Mood and affect normal.  EOMI.  Respirations unlabored.  L ankle splinted.  Skin intact in clinic 2 days ago.  No lymphadenopathy.  Brisk cap refill at toes.  Feels LT normally at toes.  5/5 strength in PF and DF of the toes.  Assessment/Plan Left ankle trimal fracture - to OR for ORIF.  The risks and benefits of the alternative treatment options have been discussed in detail.  The patient wishes to proceed with surgery and specifically understands risks of bleeding, infection, nerve damage, blood clots, need for additional surgery, amputation and  deathToni Arthurs 11/05/2012, 10:17 AM

## 2012-11-05 NOTE — Anesthesia Preprocedure Evaluation (Addendum)
Anesthesia Evaluation  Patient identified by MRN, date of birth, ID band Patient awake    Reviewed: Allergy & Precautions, H&P , NPO status   Airway Mallampati: II      Dental   Pulmonary neg pulmonary ROS,  breath sounds clear to auscultation        Cardiovascular hypertension, Rhythm:Regular Rate:Normal     Neuro/Psych negative neurological ROS     GI/Hepatic negative GI ROS, Neg liver ROS,   Endo/Other  negative endocrine ROS  Renal/GU negative Renal ROS     Musculoskeletal   Abdominal   Peds  Hematology   Anesthesia Other Findings   Reproductive/Obstetrics                          Anesthesia Physical Anesthesia Plan  ASA: III  Anesthesia Plan: General and Regional   Post-op Pain Management: MAC Combined w/ Regional for Post-op pain   Induction: Intravenous  Airway Management Planned: LMA  Additional Equipment:   Intra-op Plan:   Post-operative Plan: Extubation in OR  Informed Consent: I have reviewed the patients History and Physical, chart, labs and discussed the procedure including the risks, benefits and alternatives for the proposed anesthesia with the patient or authorized representative who has indicated his/her understanding and acceptance.   Dental advisory given  Plan Discussed with: CRNA, Surgeon and Anesthesiologist  Anesthesia Plan Comments:        Anesthesia Quick Evaluation

## 2012-11-05 NOTE — Brief Op Note (Signed)
11/05/2012  12:32 PM  PATIENT:  Brooke Wu  52 y.o. female  PRE-OPERATIVE DIAGNOSIS:  left trimalleolar fracture   POST-OPERATIVE DIAGNOSIS:  left trimalleolar fracture  Procedure(s): 1.  ORIF L ankle trimal fracture with fixation of posterior malleolus 2.  Fluoro > 1 hour 3.  Stress exam of left ankle under fluoro  SURGEON:  Toni Arthurs, MD  ASSISTANT: n/a  ANESTHESIA:   General, regional  EBL:  minimal   TOURNIQUET:   Total Tourniquet Time Documented: Thigh (Left) - 83 minutes  COMPLICATIONS:  None apparent  DISPOSITION:  Extubated, awake and stable to recovery.  DICTATION ID:   [id number]

## 2012-11-05 NOTE — Transfer of Care (Signed)
Immediate Anesthesia Transfer of Care Note  Patient: Brooke Wu  Procedure(s) Performed: Procedure(s) (LRB) with comments: OPEN REDUCTION INTERNAL FIXATION (ORIF) ANKLE FRACTURE (Left) - open reduction internal fixation LEFT ANKLE TRIMALLEOLAR FRACTURE   Patient Location: PACU  Anesthesia Type:GA combined with regional for post-op pain  Level of Consciousness: awake, sedated and patient cooperative  Airway & Oxygen Therapy: Patient Spontanous Breathing and Patient connected to face mask oxygen  Post-op Assessment: Report given to PACU RN and Post -op Vital signs reviewed and stable  Post vital signs: Reviewed and stable  Complications: No apparent anesthesia complications

## 2012-11-06 ENCOUNTER — Encounter (HOSPITAL_BASED_OUTPATIENT_CLINIC_OR_DEPARTMENT_OTHER): Payer: Self-pay | Admitting: Orthopedic Surgery

## 2013-05-20 ENCOUNTER — Other Ambulatory Visit (HOSPITAL_COMMUNITY): Payer: Self-pay | Admitting: Internal Medicine

## 2013-05-20 DIAGNOSIS — Z1231 Encounter for screening mammogram for malignant neoplasm of breast: Secondary | ICD-10-CM

## 2013-05-21 ENCOUNTER — Ambulatory Visit (HOSPITAL_COMMUNITY)
Admission: RE | Admit: 2013-05-21 | Discharge: 2013-05-21 | Disposition: A | Discharge status: Home / Self Care | Payer: BC Managed Care – PPO | Source: Ambulatory Visit | Attending: Internal Medicine | Admitting: Internal Medicine

## 2013-05-21 DIAGNOSIS — Z1231 Encounter for screening mammogram for malignant neoplasm of breast: Secondary | ICD-10-CM

## 2013-05-24 ENCOUNTER — Other Ambulatory Visit: Payer: Self-pay | Admitting: Internal Medicine

## 2013-05-24 DIAGNOSIS — R928 Other abnormal and inconclusive findings on diagnostic imaging of breast: Secondary | ICD-10-CM

## 2013-06-11 ENCOUNTER — Ambulatory Visit
Admission: RE | Admit: 2013-06-11 | Discharge: 2013-06-11 | Disposition: A | Discharge status: Home / Self Care | Payer: BC Managed Care – PPO | Source: Ambulatory Visit | Attending: Internal Medicine | Admitting: Internal Medicine

## 2013-06-11 DIAGNOSIS — R928 Other abnormal and inconclusive findings on diagnostic imaging of breast: Secondary | ICD-10-CM

## 2013-08-16 ENCOUNTER — Other Ambulatory Visit: Payer: Self-pay | Admitting: Emergency Medicine

## 2013-08-16 DIAGNOSIS — G43909 Migraine, unspecified, not intractable, without status migrainosus: Secondary | ICD-10-CM

## 2013-08-16 DIAGNOSIS — F411 Generalized anxiety disorder: Secondary | ICD-10-CM

## 2013-08-16 MED ORDER — SUMATRIPTAN SUCCINATE 100 MG PO TABS: 100.0000 mg | ORAL_TABLET | Freq: Once | ORAL | Status: DC | PRN

## 2013-08-16 MED ORDER — ALPRAZOLAM 0.5 MG PO TABS: 0.2500 mg | ORAL_TABLET | Freq: Three times a day (TID) | ORAL | Status: DC | PRN

## 2013-09-08 ENCOUNTER — Other Ambulatory Visit: Payer: Self-pay | Admitting: Internal Medicine

## 2013-09-15 ENCOUNTER — Encounter: Payer: Self-pay | Admitting: Internal Medicine

## 2013-09-15 DIAGNOSIS — B009 Herpesviral infection, unspecified: Secondary | ICD-10-CM | POA: Insufficient documentation

## 2013-09-15 DIAGNOSIS — E538 Deficiency of other specified B group vitamins: Secondary | ICD-10-CM | POA: Insufficient documentation

## 2013-09-15 DIAGNOSIS — D649 Anemia, unspecified: Secondary | ICD-10-CM | POA: Insufficient documentation

## 2013-09-16 ENCOUNTER — Encounter: Payer: Self-pay | Admitting: Emergency Medicine

## 2013-09-16 ENCOUNTER — Ambulatory Visit (INDEPENDENT_AMBULATORY_CARE_PROVIDER_SITE_OTHER): Payer: BC Managed Care – PPO | Admitting: Emergency Medicine

## 2013-09-16 VITALS — BP 118/70 | HR 64 | Temp 97.0°F | Resp 18 | Wt 131.0 lb

## 2013-09-16 DIAGNOSIS — J309 Allergic rhinitis, unspecified: Secondary | ICD-10-CM

## 2013-09-16 DIAGNOSIS — R05 Cough: Secondary | ICD-10-CM

## 2013-09-16 DIAGNOSIS — J40 Bronchitis, not specified as acute or chronic: Secondary | ICD-10-CM

## 2013-09-16 DIAGNOSIS — R5381 Other malaise: Secondary | ICD-10-CM

## 2013-09-16 DIAGNOSIS — R059 Cough, unspecified: Secondary | ICD-10-CM

## 2013-09-16 LAB — HEPATIC FUNCTION PANEL
Albumin: 3.7 g/dL (ref 3.5–5.2)
Total Bilirubin: 0.4 mg/dL (ref 0.3–1.2)

## 2013-09-16 LAB — BASIC METABOLIC PANEL WITH GFR
CO2: 30 mEq/L (ref 19–32)
GFR, Est African American: >89 mL/min
Glucose, Bld: 83 mg/dL (ref 70–99)
Potassium: 4.4 mEq/L (ref 3.5–5.3)
Sodium: 138 mEq/L (ref 135–145)

## 2013-09-16 LAB — CBC WITH DIFFERENTIAL/PLATELET
HCT: 44.7 % (ref 36.0–46.0)
Hemoglobin: 15.2 g/dL — ABNORMAL HIGH (ref 12.0–15.0)
Lymphocytes Relative: 15 % (ref 12–46)
Monocytes Absolute: 0.6 10*3/uL (ref 0.1–1.0)
Monocytes Relative: 7 % (ref 3–12)
Neutro Abs: 6.4 10*3/uL (ref 1.7–7.7)
WBC: 8.4 10*3/uL (ref 4.0–10.5)

## 2013-09-16 MED ORDER — AZITHROMYCIN 250 MG PO TABS: ORAL_TABLET | ORAL | Status: AC

## 2013-09-16 MED ORDER — METHYLPREDNISOLONE (PAK) 4 MG PO TABS: 4.0000 mg | ORAL_TABLET | Freq: Every day | ORAL | Status: DC

## 2013-09-16 MED ORDER — ALBUTEROL SULFATE HFA 108 (90 BASE) MCG/ACT IN AERS: 2.0000 | INHALATION_SPRAY | Freq: Four times a day (QID) | RESPIRATORY_TRACT | Status: DC | PRN

## 2013-09-16 MED ORDER — BENZONATATE 100 MG PO CAPS: 100.0000 mg | ORAL_CAPSULE | Freq: Three times a day (TID) | ORAL | Status: DC | PRN

## 2013-09-16 NOTE — Patient Instructions (Signed)
Bronchitis °Bronchitis is a problem of the air tubes leading to your lungs. This problem makes it hard for air to get in and out of the lungs. You may cough a lot because your air tubes are narrow. Going without care can cause lasting (chronic) bronchitis. °HOME CARE  °· Drink enough fluids to keep your pee (urine) clear or pale yellow. °· Use a cool mist humidifier. °· Quit smoking if you smoke. If you keep smoking, the bronchitis might not get better. °· Only take medicine as told by your doctor. °GET HELP RIGHT AWAY IF:  °· Coughing keeps you awake. °· You start to wheeze. °· You become more sick or weak. °· You have a hard time breathing or get short of breath. °· You cough up blood. °· Coughing lasts more than 2 weeks. °· You have a fever. °· Your baby is older than 3 months with a rectal temperature of 102° F (38.9° C) or higher. °· Your baby is 3 months old or younger with a rectal temperature of 100.4° F (38° C) or higher. °MAKE SURE YOU: °· Understand these instructions. °· Will watch your condition. °· Will get help right away if you are not doing well or get worse. °Document Released: 03/11/2008 Document Revised: 12/16/2011 Document Reviewed: 05/18/2013 °[employer]® Patient Information ©2014 [employer]. ° °

## 2013-09-17 NOTE — Progress Notes (Signed)
   Subjective:    Patient ID: Brooke Wu, female    DOB: 12-28-1960, 52 y.o.   MRN: [id number]  HPI Comments: 52 yo female with increased sinus congestion, ST, ear pressure and now with barky cough with occasional production. She has tried OTC cold with out relief. She also notes she is staying tired but worse with recent illness.  Cough Associated symptoms include rhinorrhea and a sore throat.  Sinusitis Associated symptoms include congestion, coughing, sinus pressure and a sore throat.    Current Outpatient Prescriptions on File Prior to Visit  Medication Sig Dispense Refill  . ALPRAZolam (XANAX) 0.5 MG tablet Take 0.5 tablets (0.25 mg total) by mouth 3 (three) times daily as needed. For anxiety or insomnia  90 tablet  0  . bisoprolol-hydrochlorothiazide (ZIAC) 10-6.25 MG per tablet Take 1 tablet by mouth every morning.      . Norethindrone Acet-Ethinyl Est (MICROGESTIN 1.5/30 PO) Take 1 tablet by mouth every morning.      . sertraline (ZOLOFT) 100 MG tablet TAKE 1 TABLET BY MOUTH EVERY DAY  30 tablet  1  . SUMAtriptan (IMITREX) 100 MG tablet Take 1 tablet (100 mg total) by mouth once as needed for migraine (ADvise needs). May repeat in 2 hours if headache persists or recurs.  30 tablet  1  . TRAZODONE HCL PO Take 1 tablet by mouth at bedtime.       No current facility-administered medications on file prior to visit.   ALLERGIES Crestor; Penicillins; and Sulfa antibiotics  Past Medical History  Diagnosis Date  . Hypertension   . Multiple body piercings     one in tongue-will take out for surgery-puts a plastic piece in  . HSV-2 (herpes simplex virus 2) infection   . Vitamin B 12 deficiency      Review of Systems  Constitutional: Positive for fatigue.  HENT: Positive for congestion, rhinorrhea, sinus pressure and sore throat.   Respiratory: Positive for cough.     BP 118/70  Pulse 64  Temp(Src) 97 F (36.1 C) (Temporal)  Resp 18  Wt 131 lb (59.421 kg)      Objective:   Physical Exam  Nursing note and vitals reviewed. Constitutional: She is oriented to person, place, and time. She appears well-developed and well-nourished.  HENT:  Head: Normocephalic and atraumatic.  Right Ear: External ear normal.  Left Ear: External ear normal.  Nose: Nose normal.  Mouth/Throat: Oropharynx is clear and moist. No oropharyngeal exudate.  Maxillary tenderness, yellow TMS  Eyes: Conjunctivae and EOM are normal.  Neck: Normal range of motion.  Cardiovascular: Normal rate, regular rhythm, normal heart sounds and intact distal pulses.   Pulmonary/Chest: Effort normal. She has wheezes.  Musculoskeletal: Normal range of motion.  Lymphadenopathy:    She has no cervical adenopathy.  Neurological: She is alert and oriented to person, place, and time.  Skin: Skin is warm and dry.  Psychiatric: She has a normal mood and affect. Judgment normal.          Assessment & Plan:  1. Fatigue- check labs, increase activity and H2O 2. Sinusitis/ Allergic Rhinitis/ Bronchitis- Zpak, Medrol DP 4 mg, Tessalon perles, Albuterol HFA,  ALlegra all AD. Increase H2o

## 2013-09-29 ENCOUNTER — Other Ambulatory Visit: Payer: Self-pay | Admitting: Emergency Medicine

## 2013-10-19 ENCOUNTER — Ambulatory Visit: Payer: Self-pay | Admitting: Internal Medicine

## 2013-11-14 ENCOUNTER — Other Ambulatory Visit: Payer: Self-pay | Admitting: Emergency Medicine

## 2014-02-02 ENCOUNTER — Other Ambulatory Visit: Payer: Self-pay | Admitting: Internal Medicine

## 2014-02-09 ENCOUNTER — Other Ambulatory Visit: Payer: Self-pay | Admitting: Emergency Medicine

## 2014-03-30 ENCOUNTER — Other Ambulatory Visit: Payer: Self-pay | Admitting: Physician Assistant

## 2014-04-14 ENCOUNTER — Encounter: Payer: Self-pay | Admitting: Internal Medicine

## 2014-04-14 ENCOUNTER — Ambulatory Visit (INDEPENDENT_AMBULATORY_CARE_PROVIDER_SITE_OTHER): Payer: BC Managed Care – PPO | Admitting: Internal Medicine

## 2014-04-14 ENCOUNTER — Other Ambulatory Visit: Payer: Self-pay | Admitting: *Deleted

## 2014-04-14 VITALS — BP 132/78 | HR 56 | Temp 99.7°F | Resp 16 | Ht 66.5 in | Wt 129.8 lb

## 2014-04-14 DIAGNOSIS — R7402 Elevation of levels of lactic acid dehydrogenase (LDH): Secondary | ICD-10-CM

## 2014-04-14 DIAGNOSIS — Z79899 Other long term (current) drug therapy: Secondary | ICD-10-CM

## 2014-04-14 DIAGNOSIS — J4520 Mild intermittent asthma, uncomplicated: Secondary | ICD-10-CM

## 2014-04-14 DIAGNOSIS — R740 Nonspecific elevation of levels of transaminase and lactic acid dehydrogenase [LDH]: Secondary | ICD-10-CM

## 2014-04-14 DIAGNOSIS — Z0000 Encounter for general adult medical examination without abnormal findings: Secondary | ICD-10-CM

## 2014-04-14 DIAGNOSIS — R7401 Elevation of levels of liver transaminase levels: Secondary | ICD-10-CM

## 2014-04-14 DIAGNOSIS — Z111 Encounter for screening for respiratory tuberculosis: Secondary | ICD-10-CM

## 2014-04-14 DIAGNOSIS — E559 Vitamin D deficiency, unspecified: Secondary | ICD-10-CM

## 2014-04-14 DIAGNOSIS — Z1212 Encounter for screening for malignant neoplasm of rectum: Secondary | ICD-10-CM

## 2014-04-14 DIAGNOSIS — I10 Essential (primary) hypertension: Secondary | ICD-10-CM

## 2014-04-14 DIAGNOSIS — Z113 Encounter for screening for infections with a predominantly sexual mode of transmission: Secondary | ICD-10-CM

## 2014-04-14 LAB — HEMOGLOBIN A1C
HEMOGLOBIN A1C: 5 % (ref <5.7)
MEAN PLASMA GLUCOSE: 97 mg/dL (ref <117)

## 2014-04-14 LAB — CBC WITH DIFFERENTIAL/PLATELET
Basophils Absolute: 0 10*3/uL (ref 0.0–0.1)
Basophils Relative: 0 % (ref 0–1)
Eosinophils Absolute: 0.5 10*3/uL (ref 0.0–0.7)
Eosinophils Relative: 8 % — ABNORMAL HIGH (ref 0–5)
HCT: 41.3 % (ref 36.0–46.0)
HEMOGLOBIN: 13.9 g/dL (ref 12.0–15.0)
LYMPHS ABS: 1.3 10*3/uL (ref 0.7–4.0)
LYMPHS PCT: 21 % (ref 12–46)
MCH: 32.4 pg (ref 26.0–34.0)
MCHC: 33.7 g/dL (ref 30.0–36.0)
MCV: 96.3 fL (ref 78.0–100.0)
MONOS PCT: 5 % (ref 3–12)
Monocytes Absolute: 0.3 10*3/uL (ref 0.1–1.0)
NEUTROS PCT: 66 % (ref 43–77)
Neutro Abs: 4.1 10*3/uL (ref 1.7–7.7)
PLATELETS: 215 10*3/uL (ref 150–400)
RBC: 4.29 MIL/uL (ref 3.87–5.11)
RDW: 13.5 % (ref 11.5–15.5)
WBC: 6.2 10*3/uL (ref 4.0–10.5)

## 2014-04-14 MED ORDER — SERTRALINE HCL 100 MG PO TABS: ORAL_TABLET | ORAL | Status: DC

## 2014-04-14 MED ORDER — ALPRAZOLAM 0.5 MG PO TABS: ORAL_TABLET | ORAL | Status: DC

## 2014-04-14 NOTE — Progress Notes (Signed)
Patient ID: Brooke Wu, female   DOB: [birth year], 53 y.o.   MRN: [id number]   Annual Screening Comprehensive Examination  This very nice 53 y.o.DWF presents for complete physical.  Patient has been followed for HTN, Migraine, Hyperlipidemia, and Vitamin D Deficiency.    HTN predates since 2002. Patient's BP has been controlled at home. Today's BP: 132/78 mmHg. Patient denies any cardiac symptoms as chest pain, palpitations, shortness of breath, dizziness or ankle swelling.   Patient's hyperlipidemia is controlled with diet. Last Lipids found Total Chol 140, Trig 145, HDL 37, and 74 in July 2014.   Patient is screened for  prediabetes and last A1c was 5.0% and Insulin was 10. Patient denies reactive hypoglycemic symptoms, visual blurring, diabetic polys, or paresthesias.    Finally, patient has history of Vitamin D Deficiency and last Vitamin D was 106 in July 2014 and dose was tapered from 2,000 to 1,000 u/day.   Medication Sig  . bisoprolol-hctz (ZIAC) 10-6.25 MG  Take 1 tablet by mouth every morning.  .  (MICROGESTIN 1.5/30 PO) As Directed  . SUMAtriptan (IMITREX) 100 MG  As Directed  . TRAZODONE HCL PO Take 1 tablet by mouth at bedtime.  Marland Kitchen albuterol HFA)   inhaler Inhale 2 puffs into the lungs every 6  hours as needed   Allergies  Allergen Reactions  . Crestor [Rosuvastatin]   . Penicillins Rash  . Sulfa Antibiotics Rash   Past Medical History  Diagnosis Date  . Hypertension   . Multiple body piercings     one in tongue-will take out for surgery-puts a plastic piece in  . HSV-2 (herpes simplex virus 2) infection   . Vitamin B 12 deficiency    Past Surgical History  Procedure Laterality Date  . Orif ankle fracture  11/05/2012    Procedure: OPEN REDUCTION INTERNAL FIXATION (ORIF) ANKLE FRACTURE;  Surgeon: Toni Arthurs, MD;  Location: [facility];  Service: Orthopedics;  Laterality: Left;  open reduction internal fixation LEFT ANKLE TRIMALLEOLAR FRACTURE     Family History  Problem Relation Age of Onset  . COPD Mother   . Hypertension Father   . Diabetes Father    History  Substance Use Topics  . Smoking status: Never Smoker   . Smokeless tobacco: Not on file  . Alcohol Use: 1.5 oz/week    3 drink(s) per week     Comment: occ    ROS Constitutional: Denies fever, chills, weight loss/gain, headaches, insomnia, fatigue, night sweats, and change in appetite. Eyes: Denies redness, blurred vision, diplopia, discharge, itchy, watery eyes.  ENT: Denies discharge, congestion, post nasal drip, epistaxis, sore throat, earache, hearing loss, dental pain, Tinnitus, Vertigo, Sinus pain, snoring.  Cardio: Denies chest pain, palpitations, irregular heartbeat, syncope, dyspnea, diaphoresis, orthopnea, PND, claudication, edema Respiratory: denies cough, dyspnea, DOE, pleurisy, hoarseness, laryngitis, wheezing.  Gastrointestinal: Denies dysphagia, heartburn, reflux, water brash, pain, cramps, nausea, vomiting, bloating, diarrhea, constipation, hematemesis, melena, hematochezia, jaundice, hemorrhoids Genitourinary: Denies dysuria, frequency, urgency, nocturia, hesitancy, discharge, hematuria, flank pain Breast: Breast lumps, nipple discharge, bleeding.  Musculoskeletal: Denies arthralgia, myalgia, stiffness, Jt. Swelling, pain, limp, and strain/sprain. Denies falls. Skin: Denies puritis, rash, hives, warts, acne, eczema, changing in skin lesion Neuro: No weakness, tremor, incoordination, spasms, paresthesia, pain Psychiatric: Denies confusion, memory loss, sensory loss. Denies Depression. Endocrine: Denies change in weight, skin, hair change, nocturia, and paresthesia, diabetic polys, visual blurring, hyper / hypo glycemic episodes.  Heme/Lymph: No excessive bleeding, bruising, enlarged lymph nodes.  Physical Exam  BP 132/78  P 56  T 99.7 F   R 16  Ht 5' 6.5"   Wt 129 lb 12.8 oz   BMI 20.64 kg/m2  General Appearance: Well nourished and in no  apparent distress. Eyes: PERRLA, EOMs, conjunctiva no swelling or erythema, normal fundi and vessels. Sinuses: No frontal/maxillary tenderness ENT/Mouth: EACs patent / TMs  nl. Nares clear without erythema, swelling, mucoid exudates. Oral hygiene is good. No erythema, swelling, or exudate. Tongue normal, non-obstructing. Tonsils not swollen or erythematous. Hearing normal.  Neck: Supple, thyroid normal. No bruits, nodes or JVD. Respiratory: Respiratory effort normal.  BS equal and clear bilateral without rales, rhonci, wheezing or stridor. Cardio: Heart sounds are normal with regular rate and rhythm and no murmurs, rubs or gallops. Peripheral pulses are normal and equal bilaterally without edema. No aortic or femoral bruits. Chest: symmetric with normal excursions and percussion. Breasts: Symmetric, without lumps, nipple discharge, retractions, or fibrocystic changes.  Abdomen: Flat, soft, with bowl sounds. Nontender, no guarding, rebound, hernias, masses, or organomegaly.  Lymphatics: Non tender without lymphadenopathy.  Genitourinary:  Musculoskeletal: Full ROM all peripheral extremities, joint stability, 5/5 strength, and normal gait. Skin: Warm and dry without rashes, lesions, cyanosis, clubbing or  ecchymosis.  Neuro: Cranial nerves intact, reflexes equal bilaterally. Normal muscle tone, no cerebellar symptoms. Sensation intact.  Pysch: Awake and oriented X 3, normal affect, Insight and Judgment appropriate.   Assessment and Plan  1. Annual Screening Examination 2. Hypertension  3. Hyperlipidemia - Screening 4. Pre Diabetes - Screening 5. Vitamin D Deficiency  Continue prudent diet as discussed, weight control, BP monitoring, regular exercise, and medications. Discussed med's effects and SE's. Screening labs and tests as requested with regular follow-up as recommended.

## 2014-04-14 NOTE — Patient Instructions (Addendum)
Recommend the book "The END of DIETING" by Dr Baker Janus   and the book "The END of DIABETES " by Dr Excell Seltzer  At [email or web address] - get book & Audio CD's      Being diabetic has a  300% increased risk for heart attack, stroke, cancer, and alzheimer- type vascular dementia. It is very important that you work harder with diet by avoiding all foods that are white except chicken & fish. Avoid white rice (brown & wild rice is OK), white potatoes (sweetpotatoes in moderation is OK), White bread or wheat bread or anything made out of white flour like bagels, donuts, rolls, buns, biscuits, cakes, pastries, cookies, pizza crust, and pasta (made from white flour & egg whites) - vegetarian pasta or spinach or wheat pasta is OK. Multigrain breads like Arnold's or [employer], or multigrain sandwich thins or flatbreads.  Diet, exercise and weight loss can reverse and cure diabetes in the early stages.  Diet, exercise and weight loss is very important in the control and prevention of complications of diabetes which affects every system in your body, ie. Brain - dementia/stroke, eyes - glaucoma/blindness, heart - heart attack/heart failure, kidneys - dialysis, stomach - gastric paralysis, intestines - malabsorption, nerves - severe painful neuritis, circulation - gangrene & loss of a leg(s), and finally cancer and Alzheimers.    I recommend avoid fried & greasy foods,  sweets/candy, white rice (brown or wild rice or Quinoa is OK), white potatoes (sweet potatoes are OK) - anything made from white flour - bagels, doughnuts, rolls, buns, biscuits,white and wheat breads, pizza crust and traditional pasta made of white flour & egg white(vegetarian pasta or spinach or wheat pasta is OK).  Multi-grain bread is OK - like multi-grain flat bread or sandwich thins. Avoid alcohol in excess. Exercise is also important.    Eat all the vegetables you want - avoid meat, especially red meat and dairy - especially cheese.  Cheese  is the most concentrated form of trans-fats which is the worst thing to clog up our arteries. Veggie cheese is OK which can be found in the fresh produce section at [employer] or [employer] or [employer]         Preventive Care for Adults A healthy lifestyle and preventive care can promote health and wellness. Preventive health guidelines for women include the following key practices.  A routine yearly physical is a good way to check with your health care provider about your health and preventive screening. It is a chance to share any concerns and updates on your health and to receive a thorough exam.  Visit your dentist for a routine exam and preventive care every 6 months. Brush your teeth twice a day and floss once a day. Good oral hygiene prevents tooth decay and gum disease.  The frequency of eye exams is based on your age, health, family medical history, use of contact lenses, and other factors. Follow your health care provider's recommendations for frequency of eye exams.  Eat a healthy diet. Foods like vegetables, fruits, whole grains, low-fat dairy products, and lean protein foods contain the nutrients you need without too many calories. Decrease your intake of foods high in solid fats, added sugars, and salt. Eat the right amount of calories for you.Get information about a proper diet from your health care provider, if necessary.  Regular physical exercise is one of the most important things you can do for your health. Most adults should get at least 150 minutes  of moderate-intensity exercise (any activity that increases your heart rate and causes you to sweat) each week. In addition, most adults need muscle-strengthening exercises on 2 or more days a week.  Maintain a healthy weight. The body mass index (BMI) is a screening tool to identify possible weight problems. It provides an estimate of body fat based on height and weight. Your health care provider can find your BMI, and  can help you achieve or maintain a healthy weight.For adults 20 years and older:  A BMI below 18.5 is considered underweight.  A BMI of 18.5 to 24.9 is normal.  A BMI of 25 to 29.9 is considered overweight.  A BMI of 30 and above is considered obese.  Maintain normal blood lipids and cholesterol levels by exercising and minimizing your intake of saturated fat. Eat a balanced diet with plenty of fruit and vegetables. Blood tests for lipids and cholesterol should begin at age 48 and be repeated every 5 years. If your lipid or cholesterol levels are high, you are over 50, or you are at high risk for heart disease, you may need your cholesterol levels checked more frequently.Ongoing high lipid and cholesterol levels should be treated with medicines if diet and exercise are not working.  If you smoke, find out from your health care provider how to quit. If you do not use tobacco, do not start.  Lung cancer screening is recommended for adults aged 97-80 years who are at high risk for developing lung cancer because of a history of smoking. A yearly low-dose CT scan of the lungs is recommended for people who have at least a 30-pack-year history of smoking and are a current smoker or have quit within the past 15 years. A pack year of smoking is smoking an average of 1 pack of cigarettes a day for 1 year (for example: 1 pack a day for 30 years or 2 packs a day for 15 years). Yearly screening should continue until the smoker has stopped smoking for at least 15 years. Yearly screening should be stopped for people who develop a health problem that would prevent them from having lung cancer treatment.  If you are pregnant, do not drink alcohol. If you are breastfeeding, be very cautious about drinking alcohol. If you are not pregnant and choose to drink alcohol, do not have more than 1 drink per day. One drink is considered to be 12 ounces (355 mL) of beer, 5 ounces (148 mL) of wine, or 1.5 ounces (44 mL) of  liquor.  Avoid use of street drugs. Do not share needles with anyone. Ask for help if you need support or instructions about stopping the use of drugs.  High blood pressure causes heart disease and increases the risk of stroke. Your blood pressure should be checked at least every 1 to 2 years. Ongoing high blood pressure should be treated with medicines if weight loss and exercise do not work.  If you are 48-22 years old, ask your health care provider if you should take aspirin to prevent strokes.  Diabetes screening involves taking a blood sample to check your fasting blood sugar level. This should be done once every 3 years, after age 33, if you are within normal weight and without risk factors for diabetes. Testing should be considered at a younger age or be carried out more frequently if you are overweight and have at least 1 risk factor for diabetes.  Breast cancer screening is essential preventive care for women. You should  practice "breast self-awareness." This means understanding the normal appearance and feel of your breasts and may include breast self-examination. Any changes detected, no matter how small, should be reported to a health care provider. Women in their 54s and 30s should have a clinical breast exam (CBE) by a health care provider as part of a regular health exam every 1 to 3 years. After age 52, women should have a CBE every year. Starting at age 80, women should consider having a mammogram (breast X-ray test) every year. Women who have a family history of breast cancer should talk to their health care provider about genetic screening. Women at a high risk of breast cancer should talk to their health care providers about having an MRI and a mammogram every year.  Breast cancer gene (BRCA)-related cancer risk assessment is recommended for women who have family members with BRCA-related cancers. BRCA-related cancers include breast, ovarian, tubal, and peritoneal cancers. Having  family members with these cancers may be associated with an increased risk for harmful changes (mutations) in the breast cancer genes BRCA1 and BRCA2. Results of the assessment will determine the need for genetic counseling and BRCA1 and BRCA2 testing.  Routine pelvic exams to screen for cancer are no longer recommended for nonpregnant women who are considered low risk for cancer of the pelvic organs (ovaries, uterus, and vagina) and who do not have symptoms. Ask your health care provider if a screening pelvic exam is right for you.  If you have had past treatment for cervical cancer or a condition that could lead to cancer, you need Pap tests and screening for cancer for at least 20 years after your treatment. If Pap tests have been discontinued, your risk factors (such as having a new sexual partner) need to be reassessed to determine if screening should be resumed. Some women have medical problems that increase the chance of getting cervical cancer. In these cases, your health care provider may recommend more frequent screening and Pap tests.  The HPV test is an additional test that may be used for cervical cancer screening. The HPV test looks for the virus that can cause the cell changes on the cervix. The cells collected during the Pap test can be tested for HPV. The HPV test could be used to screen women aged 32 years and older, and should be used in women of any age who have unclear Pap test results. After the age of 10, women should have HPV testing at the same frequency as a Pap test.  Colorectal cancer can be detected and often prevented. Most routine colorectal cancer screening begins at the age of 52 years and continues through age 8 years. However, your health care provider may recommend screening at an earlier age if you have risk factors for colon cancer. On a yearly basis, your health care provider may provide home test kits to check for hidden blood in the stool. Use of a small camera at  the end of a tube, to directly examine the colon (sigmoidoscopy or colonoscopy), can detect the earliest forms of colorectal cancer. Talk to your health care provider about this at age 57, when routine screening begins. Direct exam of the colon should be repeated every 5-10 years through age 36 years, unless early forms of pre-cancerous polyps or small growths are found.  People who are at an increased risk for hepatitis B should be screened for this virus. You are considered at high risk for hepatitis B if:  You were born  in a country where hepatitis B occurs often. Talk with your health care provider about which countries are considered high risk.  Your parents were born in a high-risk country and you have not received a shot to protect against hepatitis B (hepatitis B vaccine).  You have HIV or AIDS.  You use needles to inject street drugs.  You live with, or have sex with, someone who has Hepatitis B.  You get hemodialysis treatment.  You take certain medicines for conditions like cancer, organ transplantation, and autoimmune conditions.  Hepatitis C blood testing is recommended for all people born from [birth year] through 1967 and any individual with known risks for hepatitis C.  Practice safe sex. Use condoms and avoid high-risk sexual practices to reduce the spread of sexually transmitted infections (STIs). STIs include gonorrhea, chlamydia, syphilis, trichomonas, herpes, HPV, and human immunodeficiency virus (HIV). Herpes, HIV, and HPV are viral illnesses that have no cure. They can result in disability, cancer, and death.  You should be screened for sexually transmitted illnesses (STIs) including gonorrhea and chlamydia if:  You are sexually active and are younger than 24 years.  You are older than 24 years and your health care provider tells you that you are at risk for this type of infection.  Your sexual activity has changed since you were last screened and you are at an increased  risk for chlamydia or gonorrhea. Ask your health care provider if you are at risk.  If you are at risk of being infected with HIV, it is recommended that you take a prescription medicine daily to prevent HIV infection. This is called preexposure prophylaxis (PrEP). You are considered at risk if:  You are a heterosexual woman, are sexually active, and are at increased risk for HIV infection.  You take drugs by injection.  You are sexually active with a partner who has HIV.  Talk with your health care provider about whether you are at high risk of being infected with HIV. If you choose to begin PrEP, you should first be tested for HIV. You should then be tested every 3 months for as long as you are taking PrEP.  Osteoporosis is a disease in which the bones lose minerals and strength with aging. This can result in serious bone fractures or breaks. The risk of osteoporosis can be identified using a bone density scan. Women ages 74 years and over and women at risk for fractures or osteoporosis should discuss screening with their health care providers. Ask your health care provider whether you should take a calcium supplement or vitamin D to reduce the rate of osteoporosis.  Menopause can be associated with physical symptoms and risks. Hormone replacement therapy is available to decrease symptoms and risks. You should talk to your health care provider about whether hormone replacement therapy is right for you.  Use sunscreen. Apply sunscreen liberally and repeatedly throughout the day. You should seek shade when your shadow is shorter than you. Protect yourself by wearing long sleeves, pants, a wide-brimmed hat, and sunglasses year round, whenever you are outdoors.  Once a month, do a whole body skin exam, using a mirror to look at the skin on your back. Tell your health care provider of new moles, moles that have irregular borders, moles that are larger than a pencil eraser, or moles that have changed  in shape or color.  Stay current with required vaccines (immunizations).  Influenza vaccine. All adults should be immunized every year.  Tetanus, diphtheria, and acellular pertussis (  Td, Tdap) vaccine. Pregnant women should receive 1 dose of Tdap vaccine during each pregnancy. The dose should be obtained regardless of the length of time since the last dose. Immunization is preferred during the 27th-36th week of gestation. An adult who has not previously received Tdap or who does not know her vaccine status should receive 1 dose of Tdap. This initial dose should be followed by tetanus and diphtheria toxoids (Td) booster doses every 10 years. Adults with an unknown or incomplete history of completing a 3-dose immunization series with Td-containing vaccines should begin or complete a primary immunization series including a Tdap dose. Adults should receive a Td booster every 10 years.  Varicella vaccine. An adult without evidence of immunity to varicella should receive 2 doses or a second dose if she has previously received 1 dose. Pregnant females who do not have evidence of immunity should receive the first dose after pregnancy. This first dose should be obtained before leaving the health care facility. The second dose should be obtained 4-8 weeks after the first dose.  Human papillomavirus (HPV) vaccine. Females aged 13-26 years who have not received the vaccine previously should obtain the 3-dose series. The vaccine is not recommended for use in pregnant females. However, pregnancy testing is not needed before receiving a dose. If a female is found to be pregnant after receiving a dose, no treatment is needed. In that case, the remaining doses should be delayed until after the pregnancy. Immunization is recommended for any person with an immunocompromised condition through the age of 41 years if she did not get any or all doses earlier. During the 3-dose series, the second dose should be obtained 4-8 weeks  after the first dose. The third dose should be obtained 24 weeks after the first dose and 16 weeks after the second dose.  Zoster vaccine. One dose is recommended for adults aged 16 years or older unless certain conditions are present.  Measles, mumps, and rubella (MMR) vaccine. Adults born before [birth year] generally are considered immune to measles and mumps. Adults born in [birth year] or later should have 1 or more doses of MMR vaccine unless there is a contraindication to the vaccine or there is laboratory evidence of immunity to each of the three diseases. A routine second dose of MMR vaccine should be obtained at least 28 days after the first dose for students attending postsecondary schools, health care workers, or international travelers. People who received inactivated measles vaccine or an unknown type of measles vaccine during 1963-1967 should receive 2 doses of MMR vaccine. People who received inactivated mumps vaccine or an unknown type of mumps vaccine before 1979 and are at high risk for mumps infection should consider immunization with 2 doses of MMR vaccine. For females of childbearing age, rubella immunity should be determined. If there is no evidence of immunity, females who are not pregnant should be vaccinated. If there is no evidence of immunity, females who are pregnant should delay immunization until after pregnancy. Unvaccinated health care workers born before [birth year] who lack laboratory evidence of measles, mumps, or rubella immunity or laboratory confirmation of disease should consider measles and mumps immunization with 2 doses of MMR vaccine or rubella immunization with 1 dose of MMR vaccine.  Pneumococcal 13-valent conjugate (PCV13) vaccine. When indicated, a person who is uncertain of her immunization history and has no record of immunization should receive the PCV13 vaccine. An adult aged 12 years or older who has certain medical conditions and has not been previously  immunized should receive 1  dose of PCV13 vaccine. This PCV13 should be followed with a dose of pneumococcal polysaccharide (PPSV23) vaccine. The PPSV23 vaccine dose should be obtained at least 8 weeks after the dose of PCV13 vaccine. An adult aged 78 years or older who has certain medical conditions and previously received 1 or more doses of PPSV23 vaccine should receive 1 dose of PCV13. The PCV13 vaccine dose should be obtained 1 or more years after the last PPSV23 vaccine dose.  Pneumococcal polysaccharide (PPSV23) vaccine. When PCV13 is also indicated, PCV13 should be obtained first. All adults aged 65 years and older should be immunized. An adult younger than age 66 years who has certain medical conditions should be immunized. Any person who resides in a nursing home or long-term care facility should be immunized. An adult smoker should be immunized. People with an immunocompromised condition and certain other conditions should receive both PCV13 and PPSV23 vaccines. People with human immunodeficiency virus (HIV) infection should be immunized as soon as possible after diagnosis. Immunization during chemotherapy or radiation therapy should be avoided. Routine use of PPSV23 vaccine is not recommended for American Indians, [place] Natives, or people younger than 65 years unless there are medical conditions that require PPSV23 vaccine. When indicated, people who have unknown immunization and have no record of immunization should receive PPSV23 vaccine. One-time revaccination 5 years after the first dose of PPSV23 is recommended for people aged 19-64 years who have chronic kidney failure, nephrotic syndrome, asplenia, or immunocompromised conditions. People who received 1-2 doses of PPSV23 before age 75 years should receive another dose of PPSV23 vaccine at age 20 years or later if at least 5 years have passed since the previous dose. Doses of PPSV23 are not needed for people immunized with PPSV23 at or after age 44 years.  Meningococcal  vaccine. Adults with asplenia or persistent complement component deficiencies should receive 2 doses of quadrivalent meningococcal conjugate (MenACWY-D) vaccine. The doses should be obtained at least 2 months apart. Microbiologists working with certain meningococcal bacteria, [employer] recruits, people at risk during an outbreak, and people who travel to or live in countries with a high rate of meningitis should be immunized. A first-year college student up through age 34 years who is living in a residence hall should receive a dose if she did not receive a dose on or after her 16th birthday. Adults who have certain high-risk conditions should receive one or more doses of vaccine.  Hepatitis A vaccine. Adults who wish to be protected from this disease, have certain high-risk conditions, work with hepatitis A-infected animals, work in hepatitis A research labs, or travel to or work in countries with a high rate of hepatitis A should be immunized. Adults who were previously unvaccinated and who anticipate close contact with an international adoptee during the first 60 days after arrival in the [place] from a country with a high rate of hepatitis A should be immunized.  Hepatitis B vaccine. Adults who wish to be protected from this disease, have certain high-risk conditions, may be exposed to blood or other infectious body fluids, are household contacts or sex partners of hepatitis B positive people, are clients or workers in certain care facilities, or travel to or work in countries with a high rate of hepatitis B should be immunized.  Haemophilus influenzae type b (Hib) vaccine. A previously unvaccinated person with asplenia or sickle cell disease or having a scheduled splenectomy should receive 1 dose of Hib vaccine. Regardless of previous  immunization, a recipient of a hematopoietic stem cell transplant should receive a 3-dose series 6-12 months after her successful transplant. Hib vaccine is not  recommended for adults with HIV infection. Preventive Services / Frequency Ages 79 to 39years  Blood pressure check.** / Every 1 to 2 years.  Lipid and cholesterol check.** / Every 5 years beginning at age 44.  Clinical breast exam.** / Every 3 years for women in their 24s and 16s.  BRCA-related cancer risk assessment.** / For women who have family members with a BRCA-related cancer (breast, ovarian, tubal, or peritoneal cancers).  Pap test.** / Every 2 years from ages 35 through 73. Every 3 years starting at age 24 through age 75 or 54 with a history of 3 consecutive normal Pap tests.  HPV screening.** / Every 3 years from ages 5 through ages 72 to 28 with a history of 3 consecutive normal Pap tests.  Hepatitis C blood test.** / For any individual with known risks for hepatitis C.  Skin self-exam. / Monthly.  Influenza vaccine. / Every year.  Tetanus, diphtheria, and acellular pertussis (Tdap, Td) vaccine.** / Consult your health care provider. Pregnant women should receive 1 dose of Tdap vaccine during each pregnancy. 1 dose of Td every 10 years.  Varicella vaccine.** / Consult your health care provider. Pregnant females who do not have evidence of immunity should receive the first dose after pregnancy.  HPV vaccine. / 3 doses over 6 months, if 46 and younger. The vaccine is not recommended for use in pregnant females. However, pregnancy testing is not needed before receiving a dose.  Measles, mumps, rubella (MMR) vaccine.** / You need at least 1 dose of MMR if you were born in 1957 or later. You may also need a 2nd dose. For females of childbearing age, rubella immunity should be determined. If there is no evidence of immunity, females who are not pregnant should be vaccinated. If there is no evidence of immunity, females who are pregnant should delay immunization until after pregnancy.  Pneumococcal 13-valent conjugate (PCV13) vaccine.** / Consult your health care  provider.  Pneumococcal polysaccharide (PPSV23) vaccine.** / 1 to 2 doses if you smoke cigarettes or if you have certain conditions.  Meningococcal vaccine.** / 1 dose if you are age 79 to 12 years and a Market researcher living in a residence hall, or have one of several medical conditions, you need to get vaccinated against meningococcal disease. You may also need additional booster doses.  Hepatitis A vaccine.** / Consult your health care provider.  Hepatitis B vaccine.** / Consult your health care provider.  Haemophilus influenzae type b (Hib) vaccine.** / Consult your health care provider. Ages 60 to 64years  Blood pressure check.** / Every 1 to 2 years.  Lipid and cholesterol check.** / Every 5 years beginning at age 29 years.  Lung cancer screening. / Every year if you are aged 12-80 years and have a 30-pack-year history of smoking and currently smoke or have quit within the past 15 years. Yearly screening is stopped once you have quit smoking for at least 15 years or develop a health problem that would prevent you from having lung cancer treatment.  Clinical breast exam.** / Every year after age 73 years.  BRCA-related cancer risk assessment.** / For women who have family members with a BRCA-related cancer (breast, ovarian, tubal, or peritoneal cancers).  Mammogram.** / Every year beginning at age 56 years and continuing for as long as you are in good health. Consult  with your health care provider.  Pap test.** / Every 3 years starting at age 30 years through age 109 or 57 years with a history of 3 consecutive normal Pap tests.  HPV screening.** / Every 3 years from ages 63 years through ages 62 to 48 years with a history of 3 consecutive normal Pap tests.  Fecal occult blood test (FOBT) of stool. / Every year beginning at age 16 years and continuing until age 48 years. You may not need to do this test if you get a colonoscopy every 10 years.  Flexible sigmoidoscopy or  colonoscopy.** / Every 5 years for a flexible sigmoidoscopy or every 10 years for a colonoscopy beginning at age 8 years and continuing until age 40 years.  Hepatitis C blood test.** / For all people born from [birth year] through 1967 and any individual with known risks for hepatitis C.  Skin self-exam. / Monthly.  Influenza vaccine. / Every year.  Tetanus, diphtheria, and acellular pertussis (Tdap/Td) vaccine.** / Consult your health care provider. Pregnant women should receive 1 dose of Tdap vaccine during each pregnancy. 1 dose of Td every 10 years.  Varicella vaccine.** / Consult your health care provider. Pregnant females who do not have evidence of immunity should receive the first dose after pregnancy.  Zoster vaccine.** / 1 dose for adults aged 41 years or older.  Measles, mumps, rubella (MMR) vaccine.** / You need at least 1 dose of MMR if you were born in 1957 or later. You may also need a 2nd dose. For females of childbearing age, rubella immunity should be determined. If there is no evidence of immunity, females who are not pregnant should be vaccinated. If there is no evidence of immunity, females who are pregnant should delay immunization until after pregnancy.  Pneumococcal 13-valent conjugate (PCV13) vaccine.** / Consult your health care provider.  Pneumococcal polysaccharide (PPSV23) vaccine.** / 1 to 2 doses if you smoke cigarettes or if you have certain conditions.  Meningococcal vaccine.** / Consult your health care provider.  Hepatitis A vaccine.** / Consult your health care provider.  Hepatitis B vaccine.** / Consult your health care provider.  Haemophilus influenzae type b (Hib) vaccine.** / Consult your health care provider. Ages 67 years and over  Blood pressure check.** / Every 1 to 2 years.  Lipid and cholesterol check.** / Every 5 years beginning at age 57 years.  Lung cancer screening. / Every year if you are aged 34-80 years and have a 30-pack-year history of  smoking and currently smoke or have quit within the past 15 years. Yearly screening is stopped once you have quit smoking for at least 15 years or develop a health problem that would prevent you from having lung cancer treatment.  Clinical breast exam.** / Every year after age 45 years.  BRCA-related cancer risk assessment.** / For women who have family members with a BRCA-related cancer (breast, ovarian, tubal, or peritoneal cancers).  Mammogram.** / Every year beginning at age 14 years and continuing for as long as you are in good health. Consult with your health care provider.  Pap test.** / Every 3 years starting at age 10 years through age 78 or 19 years with 3 consecutive normal Pap tests. Testing can be stopped between 65 and 70 years with 3 consecutive normal Pap tests and no abnormal Pap or HPV tests in the past 10 years.  HPV screening.** / Every 3 years from ages 83 years through ages 73 or 42 years with a history of  3 consecutive normal Pap tests. Testing can be stopped between 65 and 70 years with 3 consecutive normal Pap tests and no abnormal Pap or HPV tests in the past 10 years.  Fecal occult blood test (FOBT) of stool. / Every year beginning at age 49 years and continuing until age 77 years. You may not need to do this test if you get a colonoscopy every 10 years.  Flexible sigmoidoscopy or colonoscopy.** / Every 5 years for a flexible sigmoidoscopy or every 10 years for a colonoscopy beginning at age 74 years and continuing until age 68 years.  Hepatitis C blood test.** / For all people born from [birth year] through 1967 and any individual with known risks for hepatitis C.  Osteoporosis screening.** / A one-time screening for women ages 32 years and over and women at risk for fractures or osteoporosis.  Skin self-exam. / Monthly.  Influenza vaccine. / Every year.  Tetanus, diphtheria, and acellular pertussis (Tdap/Td) vaccine.** / 1 dose of Td every 10 years.  Varicella  vaccine.** / Consult your health care provider.  Zoster vaccine.** / 1 dose for adults aged 97 years or older.  Pneumococcal 13-valent conjugate (PCV13) vaccine.** / Consult your health care provider.  Pneumococcal polysaccharide (PPSV23) vaccine.** / 1 dose for all adults aged 17 years and older.  Meningococcal vaccine.** / Consult your health care provider.  Hepatitis A vaccine.** / Consult your health care provider.  Hepatitis B vaccine.** / Consult your health care provider.  Haemophilus influenzae type b (Hib) vaccine.** / Consult your health care provider. ** Family history and personal history of risk and conditions may change your health care provider's recommendations.

## 2014-04-15 LAB — URINALYSIS, MICROSCOPIC ONLY
Bacteria, UA: NONE SEEN
Casts: NONE SEEN
Crystals: NONE SEEN
SQUAMOUS EPITHELIAL / LPF: NONE SEEN

## 2014-04-15 LAB — HIV ANTIBODY (ROUTINE TESTING W REFLEX): HIV: NONREACTIVE

## 2014-04-15 LAB — BASIC METABOLIC PANEL WITH GFR
BUN: 16 mg/dL (ref 6–23)
CHLORIDE: 102 meq/L (ref 96–112)
CO2: 27 meq/L (ref 19–32)
Calcium: 8.9 mg/dL (ref 8.4–10.5)
Creat: 0.76 mg/dL (ref 0.50–1.10)
GFR, Est African American: >89 mL/min
GFR, Est Non African American: 89 mL/min
Glucose, Bld: 109 mg/dL — ABNORMAL HIGH (ref 70–99)
POTASSIUM: 3.8 meq/L (ref 3.5–5.3)
SODIUM: 138 meq/L (ref 135–145)

## 2014-04-15 LAB — TSH: TSH: 1.661 u[IU]/mL (ref 0.350–4.500)

## 2014-04-15 LAB — HEPATITIS B SURFACE ANTIBODY,QUALITATIVE: Hep B S Ab: NEGATIVE

## 2014-04-15 LAB — HEPATIC FUNCTION PANEL
ALK PHOS: 37 U/L — AB (ref 39–117)
ALT: 18 U/L (ref 0–35)
AST: 25 U/L (ref 0–37)
Albumin: 4 g/dL (ref 3.5–5.2)
BILIRUBIN DIRECT: 0.1 mg/dL (ref 0.0–0.3)
BILIRUBIN INDIRECT: 0.3 mg/dL (ref 0.2–1.2)
BILIRUBIN TOTAL: 0.4 mg/dL (ref 0.2–1.2)
Total Protein: 6.2 g/dL (ref 6.0–8.3)

## 2014-04-15 LAB — HEPATITIS A ANTIBODY, TOTAL: HEP A TOTAL AB: NONREACTIVE

## 2014-04-15 LAB — LIPID PANEL
CHOL/HDL RATIO: 3.7 ratio
Cholesterol: 148 mg/dL (ref 0–200)
HDL: 40 mg/dL (ref >39)
LDL CALC: 77 mg/dL (ref 0–99)
Triglycerides: 153 mg/dL — ABNORMAL HIGH (ref <150)
VLDL: 31 mg/dL (ref 0–40)

## 2014-04-15 LAB — RPR

## 2014-04-15 LAB — MICROALBUMIN / CREATININE URINE RATIO
CREATININE, URINE: 68 mg/dL
MICROALB UR: 0.5 mg/dL (ref 0.00–1.89)
MICROALB/CREAT RATIO: 7.4 mg/g (ref 0.0–30.0)

## 2014-04-15 LAB — HEPATITIS B CORE ANTIBODY, TOTAL: Hep B Core Total Ab: NONREACTIVE

## 2014-04-15 LAB — VITAMIN D 25 HYDROXY (VIT D DEFICIENCY, FRACTURES): Vit D, 25-Hydroxy: 105 ng/mL — ABNORMAL HIGH (ref 30–89)

## 2014-04-15 LAB — INSULIN, FASTING: INSULIN FASTING, SERUM: 26 u[IU]/mL (ref 3–28)

## 2014-04-15 LAB — MAGNESIUM: MAGNESIUM: 1.6 mg/dL (ref 1.5–2.5)

## 2014-04-15 LAB — VITAMIN B12: Vitamin B-12: 369 pg/mL (ref 211–911)

## 2014-04-15 LAB — HEPATITIS C ANTIBODY: HCV AB: NEGATIVE

## 2014-04-18 LAB — HEPATITIS B E ANTIBODY: Hepatitis Be Antibody: NONREACTIVE

## 2014-04-18 LAB — TB SKIN TEST
Induration: 0 mm
TB SKIN TEST: NEGATIVE

## 2014-08-16 ENCOUNTER — Other Ambulatory Visit: Payer: Self-pay | Admitting: Internal Medicine

## 2014-08-16 ENCOUNTER — Other Ambulatory Visit: Payer: Self-pay | Admitting: Emergency Medicine

## 2014-08-18 ENCOUNTER — Other Ambulatory Visit: Payer: Self-pay | Admitting: Internal Medicine

## 2014-08-19 ENCOUNTER — Other Ambulatory Visit: Payer: Self-pay | Admitting: *Deleted

## 2014-08-19 DIAGNOSIS — G43919 Migraine, unspecified, intractable, without status migrainosus: Secondary | ICD-10-CM

## 2014-08-19 MED ORDER — SUMATRIPTAN SUCCINATE 100 MG PO TABS: 100.0000 mg | ORAL_TABLET | Freq: Once | ORAL | Status: DC | PRN

## 2014-11-10 ENCOUNTER — Other Ambulatory Visit: Payer: Self-pay | Admitting: Internal Medicine

## 2014-11-16 ENCOUNTER — Encounter: Payer: Self-pay | Admitting: Physician Assistant

## 2014-11-16 ENCOUNTER — Ambulatory Visit (INDEPENDENT_AMBULATORY_CARE_PROVIDER_SITE_OTHER): Payer: BLUE CROSS/BLUE SHIELD | Admitting: Physician Assistant

## 2014-11-16 VITALS — BP 128/78 | HR 68 | Temp 97.7°F | Resp 16 | Ht 66.5 in | Wt 133.0 lb

## 2014-11-16 DIAGNOSIS — M542 Cervicalgia: Secondary | ICD-10-CM

## 2014-11-16 DIAGNOSIS — R197 Diarrhea, unspecified: Secondary | ICD-10-CM

## 2014-11-16 DIAGNOSIS — M79645 Pain in left finger(s): Secondary | ICD-10-CM

## 2014-11-16 MED ORDER — HYOSCYAMINE SULFATE 0.125 MG SL SUBL: 0.1250 mg | SUBLINGUAL_TABLET | Freq: Four times a day (QID) | SUBLINGUAL | Status: DC | PRN

## 2014-11-16 NOTE — Patient Instructions (Addendum)
Ova and Parasite Exam This is a microscopic exam of your stool to determine whether you have a parasite infecting your gastrointestinal tract. It is sometimes done if you have diarrhea that lasts more than a few days and/or have blood or mucus in your loose stools, especially if you drank stream or lake water. Most people who are infected by parasites become infected by drinking water or eating food that has been contaminated with the ova. This contamination cannot be seen - the food and water will look, smell, and taste completely normal. The most common parasites are three single cell parasites: Giardia lamblia, Entamoeba histolytica (E. histolytica), and Cryptosporidium parvum. They are found in mountain streams and lakes throughout the world and may infect swimming pools, hot tubs, and occasionally community water supplies.  SAMPLE COLLECTION A fresh stool sample is collected in a clean container (or on a clean surface). The stool sample should not be contaminated with urine or water. Once it has been collected the stool is usually taken to the laboratory within about an hour after collection, or is transferred into bottles containing different preservative solutions.  Once the stool has been preserved, it should be returned to the laboratory within a day or so. If your caregiver has ordered multiple samples, you may collect and transfer all of the stool samples into preservative bottles before returning them to the laboratory. When multiple samples are ordered, they are collected at different times, often on different days. The bottles should be labeled with the patient's name and the date and time of the stool collection.  NORMAL FINDINGS Your stool specimen should test negative for ova and parasites. Ranges for normal findings may vary among different laboratories and hospitals. You should always check with your doctor after having lab work or other tests done to discuss the meaning of your test  results and whether your values are considered within normal limits. MEANING OF TEST  Your caregiver will go over the test results with you and discuss the importance and meaning of your results, as well as treatment options and the need for additional tests if necessary. OBTAINING THE TEST RESULTS It is your responsibility to obtain your test results. Ask the lab or department performing the test when and how you will get your results. Document Released: 10/26/2004 Document Revised: 12/16/2011 Document Reviewed: 09/03/2008 [facility] Patient Information 2015 [place], [place]. This information is not intended to replace advice given to you by your health care provider. Make sure you discuss any questions you have with your health care provider. Food Choices to Help Relieve Diarrhea When you have diarrhea, the foods you eat and your eating habits are very important. Choosing the right foods and drinks can help relieve diarrhea. Also, because diarrhea can last up to 7 days, you need to replace lost fluids and electrolytes (such as sodium, potassium, and chloride) in order to help prevent dehydration.  WHAT GENERAL GUIDELINES DO I NEED TO FOLLOW?  Slowly drink 1 cup (8 oz) of fluid for each episode of diarrhea. If you are getting enough fluid, your urine will be clear or pale yellow.  Eat starchy foods. Some good choices include white rice, white toast, pasta, low-fiber cereal, baked potatoes (without the skin), saltine crackers, and bagels.  Avoid large servings of any cooked vegetables.  Limit fruit to two servings per day. A serving is  cup or 1 small piece.  Choose foods with less than 2 g of fiber per serving.  Limit fats to less than  8 tsp (38 g) per day.  Avoid fried foods.  Eat foods that have probiotics in them. Probiotics can be found in certain dairy products.  Avoid foods and beverages that may increase the speed at which food moves through the stomach and intestines  (gastrointestinal tract). Things to avoid include:  High-fiber foods, such as dried fruit, raw fruits and vegetables, nuts, seeds, and whole grain foods.  Spicy foods and high-fat foods.  Foods and beverages sweetened with high-fructose corn syrup, honey, or sugar alcohols such as xylitol, sorbitol, and mannitol. WHAT FOODS ARE RECOMMENDED? Grains White rice. White, [place], or pita breads (fresh or toasted), including plain rolls, buns, or bagels. White pasta. Saltine, soda, or graham crackers. Pretzels. Low-fiber cereal. Cooked cereals made with water (such as cornmeal, farina, or cream cereals). Plain muffins. Matzo. Melba toast. Zwieback.  Vegetables Potatoes (without the skin). Strained tomato and vegetable juices. Most well-cooked and canned vegetables without seeds. Tender lettuce. Fruits Cooked or canned applesauce, apricots, cherries, fruit cocktail, grapefruit, peaches, pears, or plums. Fresh bananas, apples without skin, cherries, grapes, cantaloupe, grapefruit, peaches, oranges, or plums.  Meat and Other Protein Products Baked or boiled chicken. Eggs. Tofu. Fish. Seafood. Smooth peanut butter. Ground or well-cooked tender beef, ham, veal, lamb, pork, or poultry.  Dairy Plain yogurt, kefir, and unsweetened liquid yogurt. Lactose-free milk, buttermilk, or soy milk. Plain hard cheese. Beverages Sport drinks. Clear broths. Diluted fruit juices (except prune). Regular, caffeine-free sodas such as ginger ale. Water. Decaffeinated teas. Oral rehydration solutions. Sugar-free beverages not sweetened with sugar alcohols. Other Bouillon, broth, or soups made from recommended foods.  The items listed above may not be a complete list of recommended foods or beverages. Contact your dietitian for more options. WHAT FOODS ARE NOT RECOMMENDED? Grains Whole grain, whole wheat, bran, or rye breads, rolls, pastas, crackers, and cereals. Wild or brown rice. Cereals that contain more than 2 g of fiber  per serving. Corn tortillas or taco shells. Cooked or dry oatmeal. Granola. Popcorn. Vegetables Raw vegetables. Cabbage, broccoli, Brussels sprouts, artichokes, baked beans, beet greens, corn, kale, legumes, peas, sweet potatoes, and yams. Potato skins. Cooked spinach and cabbage. Fruits Dried fruit, including raisins and dates. Raw fruits. Stewed or dried prunes. Fresh apples with skin, apricots, mangoes, pears, raspberries, and strawberries.  Meat and Other Protein Products Chunky peanut butter. Nuts and seeds. Beans and lentils. Tomasa Blase.  Dairy High-fat cheeses. Milk, chocolate milk, and beverages made with milk, such as milk shakes. Cream. Ice cream. Sweets and Desserts Sweet rolls, doughnuts, and sweet breads. Pancakes and waffles. Fats and Oils Butter. Cream sauces. Margarine. Salad oils. Plain salad dressings. Olives. Avocados.  Beverages Caffeinated beverages (such as coffee, tea, soda, or energy drinks). Alcoholic beverages. Fruit juices with pulp. Prune juice. Soft drinks sweetened with high-fructose corn syrup or sugar alcohols. Other Coconut. Hot sauce. Chili powder. Mayonnaise. Gravy. Cream-based or milk-based soups.  The items listed above may not be a complete list of foods and beverages to avoid. Contact your dietitian for more information. WHAT SHOULD I DO IF I BECOME DEHYDRATED? Diarrhea can sometimes lead to dehydration. Signs of dehydration include dark urine and dry mouth and skin. If you think you are dehydrated, you should rehydrate with an oral rehydration solution. These solutions can be purchased at pharmacies, retail stores, or online.  Drink -1 cup (120-240 mL) of oral rehydration solution each time you have an episode of diarrhea. If drinking this amount makes your diarrhea worse, try drinking smaller amounts more often.  For example, drink 1-3 tsp (5-15 mL) every 5-10 minutes.  A general rule for staying hydrated is to drink 1-2 L of fluid per day. Talk to your  health care provider about the specific amount you should be drinking each day. Drink enough fluids to keep your urine clear or pale yellow. Document Released: 12/14/2003 Document Revised: 09/28/2013 Document Reviewed: 08/16/2013 [facility] Patient Information 2015 [place], [place]. This information is not intended to replace advice given to you by your health care provider. Make sure you discuss any questions you have with your health care provider.  Cologuard is an easy to use noninvasive colon cancer screening test based on the latest advances in stool DNA science.   Colon cancer is 3rd most diagnosed cancer and 2nd leading cause of death in both men and women 55 years of age and older despite being one of the most preventable and treatable cancers if found early. You have agreed to do a Cologuard screening and have declined a colonoscopy in spite of being explained the risks and benefits of the colonoscopy in detail, including cancer and death. Please understand that this is test not as sensitive or specific as a colonoscopy and you are still recommended to get a colonoscopy.   If you are NOT medicare please call your insurance company and given them this CPT code, [phone], in order to see how much your insurance company will cover.   You will receive a short call from [place] support center at [employer], when you receive a call they will say they are from [place],  to confirm your mailing address and give you more information.  When they calll you, it will appear on the caller ID as "[employer]" or in some cases only this number will appear, [phone].   [employer] will ship your collection kit directly to you. You will collect a single stool sample in the privacy of your own home, no special preparation required. You will return the kit via [employer] pre-paid shipping or pick-up, in the same box it arrived in. Then I will contact you to discuss your  results after I receive them from the laboratory.   If you have any questions or concerns, Cologuard Customer Support Specialist are available 24 hours a day, 7 days a week at [phone] or go to [email or web address].
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
## 2014-12-01 ENCOUNTER — Other Ambulatory Visit: Payer: Self-pay | Admitting: Internal Medicine

## 2014-12-30 ENCOUNTER — Other Ambulatory Visit: Payer: Self-pay | Admitting: Physician Assistant

## 2015-01-30 ENCOUNTER — Ambulatory Visit: Payer: Self-pay | Admitting: Internal Medicine

## 2015-01-30 ENCOUNTER — Encounter: Payer: Self-pay | Admitting: Internal Medicine

## 2015-01-30 ENCOUNTER — Ambulatory Visit (INDEPENDENT_AMBULATORY_CARE_PROVIDER_SITE_OTHER): Payer: BLUE CROSS/BLUE SHIELD | Admitting: Internal Medicine

## 2015-01-30 VITALS — BP 116/80 | HR 86 | Temp 99.8°F | Resp 18 | Ht 66.5 in | Wt 128.0 lb

## 2015-01-30 DIAGNOSIS — J069 Acute upper respiratory infection, unspecified: Secondary | ICD-10-CM

## 2015-01-30 MED ORDER — PREDNISONE 20 MG PO TABS: ORAL_TABLET | ORAL | Status: DC

## 2015-01-30 MED ORDER — AZITHROMYCIN 250 MG PO TABS: ORAL_TABLET | ORAL | Status: DC

## 2015-01-30 MED ORDER — BENZONATATE 100 MG PO CAPS: 100.0000 mg | ORAL_CAPSULE | Freq: Four times a day (QID) | ORAL | Status: DC | PRN

## 2015-01-30 NOTE — Progress Notes (Signed)
Patient ID: Brooke Wu, female   DOB: 01-07-1961, 54 y.o.   MRN: [id number]  HPI  Patient presents to the office for evaluation of cough.  It has been going on for 1 weeks.  Patient reports night > day, dry, worse with lying down.  They also endorse change in voice, chills, fever, postnasal drip, shortness of breath and sinus pressure, congestion, sore throat, ear pain, and neck pain.  .  They have tried antitussives or mucinex.  They report that nothing has worked.  They admits to other sick contacts.  Her coworker and another person was sick.    Review of Systems  Constitutional: Negative for fever, chills and malaise/fatigue.  HENT: Positive for congestion, ear pain, sore throat and tinnitus. Negative for ear discharge and nosebleeds.   Respiratory: Positive for cough and shortness of breath. Negative for sputum production and wheezing.   Cardiovascular: Negative for chest pain, palpitations and leg swelling.  Skin: Negative.   Neurological: Positive for headaches.    PE:  General:  Alert and non-toxic, WDWN, NAD HEENT: NCAT, PERLA, EOM normal, no occular discharge or erythema.  Nasal mucosal edema with sinus tenderness to palpation.  Oropharynx clear with minimal oropharyngeal edema and erythema.  Mucous membranes moist and pink. Neck:  Cervical adenopathy Chest:  RRR no MRGs.  Lungs clear to auscultation A&P with no wheezes rhonchi or rales.   Abdomen: +BS x 4 quadrants, soft, non-tender, no guarding, rigidity, or rebound. Skin: warm and dry no rash Neuro: A&Ox4, CN II-XII grossly intact  Assessment and Plan:   1. Acute URI -zyrtec -saline -mucinex - benzonatate (TESSALON PERLES) 100 MG capsule; Take 1 capsule (100 mg total) by mouth every 6 (six) hours as needed for cough.  Dispense: 30 capsule; Refill: 1 - predniSONE (DELTASONE) 20 MG tablet; 3 tabs po day one, then 2 tabs daily x 4 days  Dispense: 11 tablet; Refill: 0 - azithromycin (ZITHROMAX Z-PAK) 250 MG tablet; 2 po  day one, then 1 daily x 4 days  Dispense: 6 tablet; Refill: 0

## 2015-01-30 NOTE — Patient Instructions (Signed)

## 2015-02-03 ENCOUNTER — Other Ambulatory Visit: Payer: Self-pay | Admitting: Internal Medicine

## 2015-02-03 ENCOUNTER — Encounter: Payer: Self-pay | Admitting: Internal Medicine

## 2015-02-03 MED ORDER — FLUTICASONE PROPIONATE 50 MCG/ACT NA SUSP: 2.0000 | Freq: Every day | NASAL | Status: DC

## 2015-03-15 NOTE — Addendum Note (Signed)
Addended by: Terri PiedraFORCUCCI, Zuriel Yeaman A on: 03/15/2015 01:13 PM   Modules accepted: Kipp BroodSmartSet

## 2015-04-18 ENCOUNTER — Encounter: Payer: Self-pay | Admitting: Internal Medicine

## 2015-04-18 ENCOUNTER — Ambulatory Visit (INDEPENDENT_AMBULATORY_CARE_PROVIDER_SITE_OTHER): Payer: BLUE CROSS/BLUE SHIELD | Admitting: Internal Medicine

## 2015-04-18 VITALS — BP 124/76 | HR 60 | Temp 97.9°F | Resp 16 | Ht 66.25 in | Wt 130.6 lb

## 2015-04-18 DIAGNOSIS — L259 Unspecified contact dermatitis, unspecified cause: Secondary | ICD-10-CM

## 2015-04-18 DIAGNOSIS — Z79899 Other long term (current) drug therapy: Secondary | ICD-10-CM

## 2015-04-18 DIAGNOSIS — I10 Essential (primary) hypertension: Secondary | ICD-10-CM

## 2015-04-18 DIAGNOSIS — Z1212 Encounter for screening for malignant neoplasm of rectum: Secondary | ICD-10-CM

## 2015-04-18 DIAGNOSIS — R5383 Other fatigue: Secondary | ICD-10-CM

## 2015-04-18 DIAGNOSIS — Z111 Encounter for screening for respiratory tuberculosis: Secondary | ICD-10-CM

## 2015-04-18 DIAGNOSIS — Z6820 Body mass index (BMI) 20.0-20.9, adult: Secondary | ICD-10-CM

## 2015-04-18 DIAGNOSIS — R7309 Other abnormal glucose: Secondary | ICD-10-CM | POA: Insufficient documentation

## 2015-04-18 DIAGNOSIS — E538 Deficiency of other specified B group vitamins: Secondary | ICD-10-CM

## 2015-04-18 DIAGNOSIS — Z0000 Encounter for general adult medical examination without abnormal findings: Secondary | ICD-10-CM

## 2015-04-18 DIAGNOSIS — E559 Vitamin D deficiency, unspecified: Secondary | ICD-10-CM

## 2015-04-18 DIAGNOSIS — E7800 Pure hypercholesterolemia, unspecified: Secondary | ICD-10-CM

## 2015-04-18 LAB — CBC WITH DIFFERENTIAL/PLATELET
BASOS ABS: 0 10*3/uL (ref 0.0–0.1)
Basophils Relative: 0 % (ref 0–1)
Eosinophils Absolute: 0.1 10*3/uL (ref 0.0–0.7)
Eosinophils Relative: 2 % (ref 0–5)
HEMATOCRIT: 44.3 % (ref 36.0–46.0)
HEMOGLOBIN: 14.8 g/dL (ref 12.0–15.0)
LYMPHS ABS: 1.4 10*3/uL (ref 0.7–4.0)
Lymphocytes Relative: 25 % (ref 12–46)
MCH: 32.2 pg (ref 26.0–34.0)
MCHC: 33.4 g/dL (ref 30.0–36.0)
MCV: 96.3 fL (ref 78.0–100.0)
MONO ABS: 0.4 10*3/uL (ref 0.1–1.0)
MPV: 10.4 fL (ref 8.6–12.4)
Monocytes Relative: 8 % (ref 3–12)
NEUTROS ABS: 3.6 10*3/uL (ref 1.7–7.7)
Neutrophils Relative %: 65 % (ref 43–77)
Platelets: 231 10*3/uL (ref 150–400)
RBC: 4.6 MIL/uL (ref 3.87–5.11)
RDW: 13.2 % (ref 11.5–15.5)
WBC: 5.5 10*3/uL (ref 4.0–10.5)

## 2015-04-18 LAB — BASIC METABOLIC PANEL WITH GFR
BUN: 18 mg/dL (ref 6–23)
CALCIUM: 8.9 mg/dL (ref 8.4–10.5)
CO2: 29 mEq/L (ref 19–32)
Chloride: 100 mEq/L (ref 96–112)
Creat: 0.78 mg/dL (ref 0.50–1.10)
GFR, Est African American: >89 mL/min
GFR, Est Non African American: 86 mL/min
GLUCOSE: 103 mg/dL — AB (ref 70–99)
Potassium: 3.9 mEq/L (ref 3.5–5.3)
SODIUM: 141 meq/L (ref 135–145)

## 2015-04-18 LAB — IRON AND TIBC
%SAT: 21 % (ref 20–55)
IRON: 100 ug/dL (ref 42–145)
TIBC: 468 ug/dL (ref 250–470)
UIBC: 368 ug/dL (ref 125–400)

## 2015-04-18 LAB — LIPID PANEL
Cholesterol: 168 mg/dL (ref 0–200)
HDL: 35 mg/dL — ABNORMAL LOW (ref >=46)
LDL Cholesterol: 109 mg/dL — ABNORMAL HIGH (ref 0–99)
Total CHOL/HDL Ratio: 4.8 Ratio
Triglycerides: 118 mg/dL (ref <150)
VLDL: 24 mg/dL (ref 0–40)

## 2015-04-18 LAB — MAGNESIUM: Magnesium: 1.8 mg/dL (ref 1.5–2.5)

## 2015-04-18 LAB — HEPATIC FUNCTION PANEL
ALK PHOS: 47 U/L (ref 39–117)
ALT: 14 U/L (ref 0–35)
AST: 19 U/L (ref 0–37)
Albumin: 3.8 g/dL (ref 3.5–5.2)
BILIRUBIN TOTAL: 0.4 mg/dL (ref 0.2–1.2)
Bilirubin, Direct: 0.1 mg/dL (ref 0.0–0.3)
Indirect Bilirubin: 0.3 mg/dL (ref 0.2–1.2)
TOTAL PROTEIN: 6.2 g/dL (ref 6.0–8.3)

## 2015-04-18 MED ORDER — PREDNISONE 20 MG PO TABS: ORAL_TABLET | ORAL | Status: DC

## 2015-04-18 NOTE — Progress Notes (Signed)
Patient ID: Brooke Wu, female   DOB: 1961/05/12, 54 y.o.   MRN: [id number]  Annual Comprehensive Examination  This very nice 54 y.o. DWF presents for complete physical.  Patient has been followed for HTN and screened expectantly for Prediabetes, Hyperlipidemia and Vitamin D Deficiency.    HTN predates since 2002. Patient's BP has been controlled at home and patient denies any cardiac symptoms as chest pain, palpitations, shortness of breath, dizziness or ankle swelling. Today's BP: 124/76 mmHg    Patient's hyperlipidemia is controlled with diet and medications. Patient denies myalgias or other medication SE's. Last lipids were at goal wit sl elevated Trig.  Lab Results  Component Value Date   CHOL 148 04/14/2014   HDL 40 04/14/2014   LDLCALC 77 04/14/2014   TRIG 153* 04/14/2014   CHOLHDL 3.7 04/14/2014    Patient is screened for prediabetes/insulin resistance and patient denies reactive hypoglycemic symptoms, visual blurring, diabetic polys, or paresthesias. Last A1c was 5.0% in July 2015.   Finally, patient has history of Vitamin D Deficiency and last Vitamin D was 105 in July 2015.      Medication Sig  . albuterol  HFA  inhaler Inhale 2 puffs into the lungs every 6 (six) hours as needed for wheezing or shortness of breath.  . ALPRAZolam  0.5 MG tablet TAKE 1 TABLET BY MOUTH THREE TIMES DAILY AS NEEDED  . bisoprolol-hctz (ZIAC) 10-6.25 TAKE 1/2 TO 1 TABLET BY MOUTH EVERY DAY FOR BLOOD PRESSURE  . FLONASE  nasal spray Place 2 sprays into both nostrils daily.  Marland Kitchen. MICROGESTIN 1.5/30 Take 1 tablet by mouth every morning.  . sertraline 100 MG tablet TAKE 1 TABLET BY MOUTH DAILY FOR MOOD  . SUMAtriptan (IMITREX) 100 MG tablet Take 1 tablet (100 mg total) by mouth once as needed for migraine (ADvise needs). May repeat in 2 hours   . traZODone  150 MG tablet TAKE 1/2- 1 TABLET BY MOUTH EVERY NIGHT AT BEDTIME AS NEEDED FOR SLEEP  . hyoscyamine  SL 0.125 MG SL tablet Place 1 tablet (0.125  mg total) under the tongue every 6 (six) hours as needed for cramping (nausea, diarrhea).   Allergies  Allergen Reactions  . Crestor [Rosuvastatin]   . Penicillins Rash  . Sulfa Antibiotics Rash   Past Medical History  Diagnosis Date  . Hypertension   . Multiple body piercings     one in tongue-will take out for surgery-puts a plastic piece in  . HSV-2 (herpes simplex virus 2) infection   . Vitamin B 12 deficiency    Health Maintenance  Topic Date Due  . PAP SMEAR  07/30/1977  . COLONOSCOPY  07/30/2009  . INFLUENZA VACCINE  05/08/2015  . MAMMOGRAM  06/12/2015  . TETANUS/TDAP  02/08/2019  . HIV Screening  Completed   Immunization History  Administered Date(s) Administered  . PPD Test 04/14/2014  . Tdap 02/07/2009   Past Surgical History  Procedure Laterality Date  . Orif ankle fracture  11/05/2012    Procedure: OPEN REDUCTION INTERNAL FIXATION (ORIF) ANKLE FRACTURE;  Surgeon: Toni ArthursJohn Hewitt, MD;  Location: [facility];  Service: Orthopedics;  Laterality: Left;  open reduction internal fixation LEFT ANKLE TRIMALLEOLAR FRACTURE    Family History  Problem Relation Age of Onset  . COPD Mother   . Hypertension Father   . Diabetes Father    History  Substance Use Topics  . Smoking status: Never Smoker   . Smokeless tobacco: Not on file  . Alcohol  Use: 0.6 oz/week    1 Standard drinks or equivalent per week     Comment: occ    ROS Constitutional: Denies fever, chills, weight loss/gain, headaches, insomnia,  night sweats, and change in appetite. Does c/o fatigue. Eyes: Denies redness, blurred vision, diplopia, discharge, itchy, watery eyes.  ENT: Denies discharge, congestion, post nasal drip, epistaxis, sore throat, earache, hearing loss, dental pain, Tinnitus, Vertigo, Sinus pain, snoring.  Cardio: Denies chest pain, palpitations, irregular heartbeat, syncope, dyspnea, diaphoresis, orthopnea, PND, claudication, edema Respiratory: denies cough, dyspnea, DOE,  pleurisy, hoarseness, laryngitis, wheezing.  Gastrointestinal: Denies dysphagia, heartburn, reflux, water brash, pain, cramps, nausea, vomiting, bloating, diarrhea, constipation, hematemesis, melena, hematochezia, jaundice, hemorrhoids Genitourinary: Denies dysuria, frequency, urgency, nocturia, hesitancy, discharge, hematuria, flank pain Breast: Breast lumps, nipple discharge, bleeding.  Musculoskeletal: Denies arthralgia, myalgia, stiffness, Jt. Swelling, pain, limp, and strain/sprain. Denies falls. Skin: Denies puritis, rash, hives, warts, acne, eczema, changing in skin lesion Neuro: No weakness, tremor, incoordination, spasms, paresthesia, pain Psychiatric: Denies confusion, memory loss, sensory loss. Denies Depression. Endocrine: Denies change in weight, skin, hair change, nocturia, and paresthesia, diabetic polys, visual blurring, hyper / hypo glycemic episodes.  Heme/Lymph: No excessive bleeding, bruising, enlarged lymph nodes.  Physical Exam  BP 124/76   Pulse 60  Temp 97.9 F (  Resp 16  Ht 5' 6.25"   Wt 130 lb 9.6 oz     BMI 20.91   General Appearance: Well nourished and in no apparent distress. Eyes: PERRLA, EOMs, conjunctiva no swelling or erythema, normal fundi and vessels. Sinuses: No frontal/maxillary tenderness ENT/Mouth: EACs patent / TMs  nl. Nares clear without erythema, swelling, mucoid exudates. Oral hygiene is good. No erythema, swelling, or exudate. Tongue normal, non-obstructing. Tonsils not swollen or erythematous. Hearing normal.  Neck: Supple, thyroid normal. No bruits, nodes or JVD. Respiratory: Respiratory effort normal.  BS equal and clear bilateral without rales, rhonci, wheezing or stridor. Cardio: Heart sounds are normal with regular rate and rhythm and no murmurs, rubs or gallops. Peripheral pulses are normal and equal bilaterally without edema. No aortic or femoral bruits. Chest: symmetric with normal excursions and percussion. Breasts: Symmetric,  without lumps, nipple discharge, retractions, or fibrocystic changes.  Abdomen: Flat, soft, with bowel sounds. Nontender, no guarding, rebound, hernias, masses, or organomegaly.  Lymphatics: Non tender without lymphadenopathy.  Genitourinary:  Musculoskeletal: Full ROM all peripheral extremities, joint stability, 5/5 strength, and normal gait. Skin: Warm and dry without rashes, lesions, cyanosis, clubbing or  ecchymosis.  Neuro: Cranial nerves intact, reflexes equal bilaterally. Normal muscle tone, no cerebellar symptoms. Sensation intact.  Pysch: Awake and oriented X 3, normal affect, Insight and Judgment appropriate.   Assessment and Plan  1. Essential hypertension  - Microalbumin / creatinine urine ratio - EKG 12-Lead - [place], RETROPERITNL ABD,  LTD - TSH  2. Elevated cholesterol  - Lipid panel  3. Abnormal glucose  - Hemoglobin A1c - Insulin, random  4. Vitamin D deficiency  - Vit D  25 hydroxy   5. Vitamin B 12 deficiency   6. Screening for rectal cancer  - POC Hemoccult Bld/Stl   7. Other fatigue  - Vitamin B12 - Iron and TIBC - TSH  8. Medication management  - Urine Microscopic; Future - CBC with Differential/Platelet - BASIC METABOLIC PANEL WITH GFR - Hepatic function panel - Magnesium   Continue prudent diet as discussed, weight control, BP monitoring, regular exercise, and medications. Discussed med's effects and SE's. Screening labs and tests as requested with regular follow-up as  recommended.  Over 40 minutes of exam, counseling, chart review was performed.

## 2015-04-18 NOTE — Patient Instructions (Signed)
Recommend Adult Low dose Aspirin or   coated  Aspirin 81 mg daily   To reduce risk of Colon Cancer 20 %,   Skin Cancer 26 % ,   Melanoma 46%   and   Pancreatic cancer 60%  ++++++++++++++++++  Vitamin D goal   is between 70-100.   Please make sure that you are taking your Vitamin D as directed.   It is very important as a natural anti-inflammatory   helping hair, skin, and nails, as well as reducing stroke and heart attack risk.   It helps your bones and helps with mood.  It also decreases numerous cancer risks so please take it as directed.   Low Vit D is associated with a 200-300% higher risk for CANCER   and 200-300% higher risk for HEART   ATTACK  &  STROKE.   ......................................  It is also associated with higher death rate at younger ages,   autoimmune diseases like Rheumatoid arthritis, Lupus, Multiple Sclerosis.     Also many other serious conditions, like depression, Alzheimer's  Dementia, infertility, muscle aches, fatigue, fibromyalgia - just to name a few.  +++++++++++++++++++  Recommend the book "The END of DIETING" by Dr Joel Fuhrman   & the book "The END of DIABETES " by Dr Joel Fuhrman  At [employer] - get book & Audio CD's     Being diabetic has a  300% increased risk for heart attack, stroke, cancer, and alzheimer- type vascular dementia. It is very important that you work harder with diet by avoiding all foods that are white. Avoid white rice (brown & wild rice is OK), white potatoes (sweetpotatoes in moderation is OK), White bread or wheat bread or anything made out of white flour like bagels, donuts, rolls, buns, biscuits, cakes, pastries, cookies, pizza crust, and pasta (made from white flour & egg whites) - vegetarian pasta or spinach or wheat pasta is OK. Multigrain breads like Arnold's or [employer], or multigrain sandwich thins or flatbreads.  Diet, exercise and weight loss can reverse and cure diabetes in the early  stages.  Diet, exercise and weight loss is very important in the control and prevention of complications of diabetes which affects every system in your body, ie. Brain - dementia/stroke, eyes - glaucoma/blindness, heart - heart attack/heart failure, kidneys - dialysis, stomach - gastric paralysis, intestines - malabsorption, nerves - severe painful neuritis, circulation - gangrene & loss of a leg(s), and finally cancer and Alzheimers.    I recommend avoid fried & greasy foods,  sweets/candy, white rice (brown or wild rice or Quinoa is OK), white potatoes (sweet potatoes are OK) - anything made from white flour - bagels, doughnuts, rolls, buns, biscuits,white and wheat breads, pizza crust and traditional pasta made of white flour & egg white(vegetarian pasta or spinach or wheat pasta is OK).  Multi-grain bread is OK - like multi-grain flat bread or sandwich thins. Avoid alcohol in excess. Exercise is also important.    Eat all the vegetables you want - avoid meat, especially red meat and dairy - especially cheese.  Cheese is the most concentrated form of trans-fats which is the worst thing to clog up our arteries. Veggie cheese is OK which can be found in the fresh produce section at [employer] or [employer] or [employer]  ++++++++++++++++++++++++++   Preventive Care for Adults  A healthy lifestyle and preventive care can promote health and wellness. Preventive health guidelines for women include the following key practices.  A routine   yearly physical is a good way to check with your health care provider about your health and preventive screening. It is a chance to share any concerns and updates on your health and to receive a thorough exam.  Visit your dentist for a routine exam and preventive care every 6 months. Brush your teeth twice a day and floss once a day. Good oral hygiene prevents tooth decay and gum disease.  The frequency of eye exams is based on your age, health, family medical  history, use of contact lenses, and other factors. Follow your health care provider's recommendations for frequency of eye exams.  Eat a healthy diet. Foods like vegetables, fruits, whole grains, low-fat dairy products, and lean protein foods contain the nutrients you need without too many calories. Decrease your intake of foods high in solid fats, added sugars, and salt. Eat the right amount of calories for you.Get information about a proper diet from your health care provider, if necessary.  Regular physical exercise is one of the most important things you can do for your health. Most adults should get at least 150 minutes of moderate-intensity exercise (any activity that increases your heart rate and causes you to sweat) each week. In addition, most adults need muscle-strengthening exercises on 2 or more days a week.  Maintain a healthy weight. The body mass index (BMI) is a screening tool to identify possible weight problems. It provides an estimate of body fat based on height and weight. Your health care provider can find your BMI and can help you achieve or maintain a healthy weight.For adults 20 years and older:  A BMI below 18.5 is considered underweight.  A BMI of 18.5 to 24.9 is normal.  A BMI of 25 to 29.9 is considered overweight.  A BMI of 30 and above is considered obese.  Maintain normal blood lipids and cholesterol levels by exercising and minimizing your intake of saturated fat. Eat a balanced diet with plenty of fruit and vegetables. Blood tests for lipids and cholesterol should begin at age 20 and be repeated every 5 years. If your lipid or cholesterol levels are high, you are over 50, or you are at high risk for heart disease, you may need your cholesterol levels checked more frequently.Ongoing high lipid and cholesterol levels should be treated with medicines if diet and exercise are not working.  If you smoke, find out from your health care provider how to quit. If you do  not use tobacco, do not start.  Lung cancer screening is recommended for adults aged 55-80 years who are at high risk for developing lung cancer because of a history of smoking. A yearly low-dose CT scan of the lungs is recommended for people who have at least a 30-pack-year history of smoking and are a current smoker or have quit within the past 15 years. A pack year of smoking is smoking an average of 1 pack of cigarettes a day for 1 year (for example: 1 pack a day for 30 years or 2 packs a day for 15 years). Yearly screening should continue until the smoker has stopped smoking for at least 15 years. Yearly screening should be stopped for people who develop a health problem that would prevent them from having lung cancer treatment.  High blood pressure causes heart disease and increases the risk of stroke. Your blood pressure should be checked at least every 1 to 2 years. Ongoing high blood pressure should be treated with medicines if weight loss and exercise   do not work.  If you are 55-79 years old, ask your health care provider if you should take aspirin to prevent strokes.  Diabetes screening involves taking a blood sample to check your fasting blood sugar level. This should be done once every 3 years, after age 45, if you are within normal weight and without risk factors for diabetes. Testing should be considered at a younger age or be carried out more frequently if you are overweight and have at least 1 risk factor for diabetes.  Breast cancer screening is essential preventive care for women. You should practice "breast self-awareness." This means understanding the normal appearance and feel of your breasts and may include breast self-examination. Any changes detected, no matter how small, should be reported to a health care provider. Women in their 20s and 30s should have a clinical breast exam (CBE) by a health care provider as part of a regular health exam every 1 to 3 years. After age 40, women  should have a CBE every year. Starting at age 40, women should consider having a mammogram (breast X-ray test) every year. Women who have a family history of breast cancer should talk to their health care provider about genetic screening. Women at a high risk of breast cancer should talk to their health care providers about having an MRI and a mammogram every year.  Breast cancer gene (BRCA)-related cancer risk assessment is recommended for women who have family members with BRCA-related cancers. BRCA-related cancers include breast, ovarian, tubal, and peritoneal cancers. Having family members with these cancers may be associated with an increased risk for harmful changes (mutations) in the breast cancer genes BRCA1 and BRCA2. Results of the assessment will determine the need for genetic counseling and BRCA1 and BRCA2 testing.  Routine pelvic exams to screen for cancer are no longer recommended for nonpregnant women who are considered low risk for cancer of the pelvic organs (ovaries, uterus, and vagina) and who do not have symptoms. Ask your health care provider if a screening pelvic exam is right for you.  If you have had past treatment for cervical cancer or a condition that could lead to cancer, you need Pap tests and screening for cancer for at least 20 years after your treatment. If Pap tests have been discontinued, your risk factors (such as having a new sexual partner) need to be reassessed to determine if screening should be resumed. Some women have medical problems that increase the chance of getting cervical cancer. In these cases, your health care provider may recommend more frequent screening and Pap tests.  Colorectal cancer can be detected and often prevented. Most routine colorectal cancer screening begins at the age of 50 years and continues through age 75 years. However, your health care provider may recommend screening at an earlier age if you have risk factors for colon cancer. On a  yearly basis, your health care provider may provide home test kits to check for hidden blood in the stool. Use of a small camera at the end of a tube, to directly examine the colon (sigmoidoscopy or colonoscopy), can detect the earliest forms of colorectal cancer. Talk to your health care provider about this at age 50, when routine screening begins. Direct exam of the colon should be repeated every 5-10 years through age 75 years, unless early forms of pre-cancerous polyps or small growths are found.  Hepatitis C blood testing is recommended for all people born from 1945 through 1965 and any individual with known risks for   hepatitis C.  Pra  Osteoporosis is a disease in which the bones lose minerals and strength with aging. This can result in serious bone fractures or breaks. The risk of osteoporosis can be identified using a bone density scan. Women ages 65 years and over and women at risk for fractures or osteoporosis should discuss screening with their health care providers. Ask your health care provider whether you should take a calcium supplement or vitamin D to reduce the rate of osteoporosis.  Menopause can be associated with physical symptoms and risks. Hormone replacement therapy is available to decrease symptoms and risks. You should talk to your health care provider about whether hormone replacement therapy is right for you.  Use sunscreen. Apply sunscreen liberally and repeatedly throughout the day. You should seek shade when your shadow is shorter than you. Protect yourself by wearing long sleeves, pants, a wide-brimmed hat, and sunglasses year round, whenever you are outdoors.  Once a month, do a whole body skin exam, using a mirror to look at the skin on your back. Tell your health care provider of new moles, moles that have irregular borders, moles that are larger than a pencil eraser, or moles that have changed in shape or color.  Stay current with required vaccines  (immunizations).  Influenza vaccine. All adults should be immunized every year.  Tetanus, diphtheria, and acellular pertussis (Td, Tdap) vaccine. Pregnant women should receive 1 dose of Tdap vaccine during each pregnancy. The dose should be obtained regardless of the length of time since the last dose. Immunization is preferred during the 27th-36th week of gestation. An adult who has not previously received Tdap or who does not know her vaccine status should receive 1 dose of Tdap. This initial dose should be followed by tetanus and diphtheria toxoids (Td) booster doses every 10 years. Adults with an unknown or incomplete history of completing a 3-dose immunization series with Td-containing vaccines should begin or complete a primary immunization series including a Tdap dose. Adults should receive a Td booster every 10 years.  Varicella vaccine. An adult without evidence of immunity to varicella should receive 2 doses or a second dose if she has previously received 1 dose. Pregnant females who do not have evidence of immunity should receive the first dose after pregnancy. This first dose should be obtained before leaving the health care facility. The second dose should be obtained 4-8 weeks after the first dose.  Human papillomavirus (HPV) vaccine. Females aged 13-26 years who have not received the vaccine previously should obtain the 3-dose series. The vaccine is not recommended for use in pregnant females. However, pregnancy testing is not needed before receiving a dose. If a female is found to be pregnant after receiving a dose, no treatment is needed. In that case, the remaining doses should be delayed until after the pregnancy. Immunization is recommended for any person with an immunocompromised condition through the age of 26 years if she did not get any or all doses earlier. During the 3-dose series, the second dose should be obtained 4-8 weeks after the first dose. The third dose should be obtained  24 weeks after the first dose and 16 weeks after the second dose.  Zoster vaccine. One dose is recommended for adults aged 60 years or older unless certain conditions are present.  Measles, mumps, and rubella (MMR) vaccine. Adults born before 1957 generally are considered immune to measles and mumps. Adults born in 1957 or later should have 1 or more doses of MMR   vaccine unless there is a contraindication to the vaccine or there is laboratory evidence of immunity to each of the three diseases. A routine second dose of MMR vaccine should be obtained at least 28 days after the first dose for students attending postsecondary schools, health care workers, or international travelers. People who received inactivated measles vaccine or an unknown type of measles vaccine during 1963-1967 should receive 2 doses of MMR vaccine. People who received inactivated mumps vaccine or an unknown type of mumps vaccine before 1979 and are at high risk for mumps infection should consider immunization with 2 doses of MMR vaccine. For females of childbearing age, rubella immunity should be determined. If there is no evidence of immunity, females who are not pregnant should be vaccinated. If there is no evidence of immunity, females who are pregnant should delay immunization until after pregnancy. Unvaccinated health care workers born before 1957 who lack laboratory evidence of measles, mumps, or rubella immunity or laboratory confirmation of disease should consider measles and mumps immunization with 2 doses of MMR vaccine or rubella immunization with 1 dose of MMR vaccine.  Pneumococcal 13-valent conjugate (PCV13) vaccine. When indicated, a person who is uncertain of her immunization history and has no record of immunization should receive the PCV13 vaccine. An adult aged 19 years or older who has certain medical conditions and has not been previously immunized should receive 1 dose of PCV13 vaccine. This PCV13 should be followed  with a dose of pneumococcal polysaccharide (PPSV23) vaccine. The PPSV23 vaccine dose should be obtained at least 8 weeks after the dose of PCV13 vaccine. An adult aged 19 years or older who has certain medical conditions and previously received 1 or more doses of PPSV23 vaccine should receive 1 dose of PCV13. The PCV13 vaccine dose should be obtained 1 or more years after the last PPSV23 vaccine dose.    Pneumococcal polysaccharide (PPSV23) vaccine. When PCV13 is also indicated, PCV13 should be obtained first. All adults aged 65 years and older should be immunized. An adult younger than age 65 years who has certain medical conditions should be immunized. Any person who resides in a nursing home or long-term care facility should be immunized. An adult smoker should be immunized. People with an immunocompromised condition and certain other conditions should receive both PCV13 and PPSV23 vaccines. People with human immunodeficiency virus (HIV) infection should be immunized as soon as possible after diagnosis. Immunization during chemotherapy or radiation therapy should be avoided. Routine use of PPSV23 vaccine is not recommended for American Indians, [place] Natives, or people younger than 65 years unless there are medical conditions that require PPSV23 vaccine. When indicated, people who have unknown immunization and have no record of immunization should receive PPSV23 vaccine. One-time revaccination 5 years after the first dose of PPSV23 is recommended for people aged 19-64 years who have chronic kidney failure, nephrotic syndrome, asplenia, or immunocompromised conditions. People who received 1-2 doses of PPSV23 before age 65 years should receive another dose of PPSV23 vaccine at age 65 years or later if at least 5 years have passed since the previous dose. Doses of PPSV23 are not needed for people immunized with PPSV23 at or after age 65 years.  Preventive Services / Frequency   Ages 40 to 64 years  Blood  pressure check.  Lipid and cholesterol check.  Lung cancer screening. / Every year if you are aged 55-80 years and have a 30-pack-year history of smoking and currently smoke or have quit within the past 15   years. Yearly screening is stopped once you have quit smoking for at least 15 years or develop a health problem that would prevent you from having lung cancer treatment.  Clinical breast exam.** / Every year after age 40 years.  BRCA-related cancer risk assessment.** / For women who have family members with a BRCA-related cancer (breast, ovarian, tubal, or peritoneal cancers).  Mammogram.** / Every year beginning at age 40 years and continuing for as long as you are in good health. Consult with your health care provider.  Pap test.** / Every 3 years starting at age 30 years through age 65 or 70 years with a history of 3 consecutive normal Pap tests.  HPV screening.** / Every 3 years from ages 30 years through ages 65 to 70 years with a history of 3 consecutive normal Pap tests.  Fecal occult blood test (FOBT) of stool. / Every year beginning at age 50 years and continuing until age 75 years. You may not need to do this test if you get a colonoscopy every 10 years.  Flexible sigmoidoscopy or colonoscopy.** / Every 5 years for a flexible sigmoidoscopy or every 10 years for a colonoscopy beginning at age 50 years and continuing until age 75 years.  Hepatitis C blood test.** / For all people born from 1945 through 1965 and any individual with known risks for hepatitis C.  Skin self-exam. / Monthly.  Influenza vaccine. / Every year.  Tetanus, diphtheria, and acellular pertussis (Tdap/Td) vaccine.** / Consult your health care provider. Pregnant women should receive 1 dose of Tdap vaccine during each pregnancy. 1 dose of Td every 10 years.  Varicella vaccine.** / Consult your health care provider. Pregnant females who do not have evidence of immunity should receive the first dose after  pregnancy.  Zoster vaccine.** / 1 dose for adults aged 60 years or older.  Pneumococcal 13-valent conjugate (PCV13) vaccine.** / Consult your health care provider.  Pneumococcal polysaccharide (PPSV23) vaccine.** / 1 to 2 doses if you smoke cigarettes or if you have certain conditions.  Meningococcal vaccine.** / Consult your health care provider.  Hepatitis A vaccine.** / Consult your health care provider.  Hepatitis B vaccine.** / Consult your health care provider. Screening for abdominal aortic aneurysm (AAA)  by ultrasound is recommended for people over 50 who have history of high blood pressure or who are current or former smokers. 

## 2015-04-19 LAB — MICROALBUMIN / CREATININE URINE RATIO
CREATININE, URINE: 134.6 mg/dL
MICROALB/CREAT RATIO: 3.7 mg/g (ref 0.0–30.0)
Microalb, Ur: 0.5 mg/dL (ref <2.0)

## 2015-04-19 LAB — HEMOGLOBIN A1C
Hgb A1c MFr Bld: 5.1 % (ref <5.7)
Mean Plasma Glucose: 100 mg/dL (ref <117)

## 2015-04-19 LAB — VITAMIN B12: Vitamin B-12: 1494 pg/mL — ABNORMAL HIGH (ref 211–911)

## 2015-04-19 LAB — VITAMIN D 25 HYDROXY (VIT D DEFICIENCY, FRACTURES): Vit D, 25-Hydroxy: 84 ng/mL (ref 30–100)

## 2015-04-19 LAB — TSH: TSH: 1.281 u[IU]/mL (ref 0.350–4.500)

## 2015-04-19 LAB — INSULIN, RANDOM: Insulin: 7.9 u[IU]/mL (ref 2.0–19.6)

## 2015-05-16 ENCOUNTER — Other Ambulatory Visit: Payer: Self-pay | Admitting: Internal Medicine

## 2015-05-16 DIAGNOSIS — F419 Anxiety disorder, unspecified: Secondary | ICD-10-CM

## 2015-08-23 ENCOUNTER — Other Ambulatory Visit: Payer: Self-pay | Admitting: Internal Medicine

## 2015-08-27 ENCOUNTER — Encounter: Payer: Self-pay | Admitting: *Deleted

## 2015-08-29 ENCOUNTER — Other Ambulatory Visit: Payer: Self-pay | Admitting: Internal Medicine

## 2015-10-02 ENCOUNTER — Encounter: Payer: Self-pay | Admitting: *Deleted

## 2015-10-29 ENCOUNTER — Other Ambulatory Visit: Payer: Self-pay | Admitting: Physician Assistant

## 2015-11-15 ENCOUNTER — Other Ambulatory Visit: Payer: Self-pay | Admitting: Internal Medicine

## 2015-11-15 DIAGNOSIS — F411 Generalized anxiety disorder: Secondary | ICD-10-CM

## 2015-11-15 MED ORDER — ALPRAZOLAM 0.5 MG PO TABS: 0.5000 mg | ORAL_TABLET | Freq: Three times a day (TID) | ORAL | Status: DC | PRN

## 2016-03-28 ENCOUNTER — Other Ambulatory Visit: Payer: Self-pay | Admitting: Internal Medicine

## 2016-03-28 DIAGNOSIS — F411 Generalized anxiety disorder: Secondary | ICD-10-CM

## 2016-03-28 MED ORDER — ALPRAZOLAM 0.5 MG PO TABS: 0.5000 mg | ORAL_TABLET | Freq: Three times a day (TID) | ORAL | Status: DC | PRN

## 2016-05-07 ENCOUNTER — Ambulatory Visit (INDEPENDENT_AMBULATORY_CARE_PROVIDER_SITE_OTHER): Payer: BLUE CROSS/BLUE SHIELD | Admitting: Internal Medicine

## 2016-05-07 ENCOUNTER — Encounter: Payer: Self-pay | Admitting: Internal Medicine

## 2016-05-07 VITALS — BP 112/72 | HR 56 | Temp 97.5°F | Resp 16 | Ht 66.5 in | Wt 129.4 lb

## 2016-05-07 DIAGNOSIS — R5383 Other fatigue: Secondary | ICD-10-CM

## 2016-05-07 DIAGNOSIS — I10 Essential (primary) hypertension: Secondary | ICD-10-CM

## 2016-05-07 DIAGNOSIS — Z1211 Encounter for screening for malignant neoplasm of colon: Secondary | ICD-10-CM

## 2016-05-07 DIAGNOSIS — E559 Vitamin D deficiency, unspecified: Secondary | ICD-10-CM | POA: Diagnosis not present

## 2016-05-07 DIAGNOSIS — E2839 Other primary ovarian failure: Secondary | ICD-10-CM

## 2016-05-07 DIAGNOSIS — Z136 Encounter for screening for cardiovascular disorders: Secondary | ICD-10-CM

## 2016-05-07 DIAGNOSIS — E7800 Pure hypercholesterolemia, unspecified: Secondary | ICD-10-CM

## 2016-05-07 DIAGNOSIS — Z0000 Encounter for general adult medical examination without abnormal findings: Secondary | ICD-10-CM | POA: Diagnosis not present

## 2016-05-07 DIAGNOSIS — Z0001 Encounter for general adult medical examination with abnormal findings: Secondary | ICD-10-CM

## 2016-05-07 DIAGNOSIS — Z111 Encounter for screening for respiratory tuberculosis: Secondary | ICD-10-CM | POA: Diagnosis not present

## 2016-05-07 DIAGNOSIS — R7309 Other abnormal glucose: Secondary | ICD-10-CM

## 2016-05-07 DIAGNOSIS — Z79899 Other long term (current) drug therapy: Secondary | ICD-10-CM | POA: Diagnosis not present

## 2016-05-07 DIAGNOSIS — E538 Deficiency of other specified B group vitamins: Secondary | ICD-10-CM

## 2016-05-07 LAB — CBC WITH DIFFERENTIAL/PLATELET
BASOS ABS: 0 {cells}/uL (ref 0–200)
Basophils Relative: 0 %
EOS PCT: 2 %
Eosinophils Absolute: 144 cells/uL (ref 15–500)
HCT: 43.4 % (ref 35.0–45.0)
HEMOGLOBIN: 14.9 g/dL (ref 11.7–15.5)
LYMPHS PCT: 18 %
Lymphs Abs: 1296 cells/uL (ref 850–3900)
MCH: 32.7 pg (ref 27.0–33.0)
MCHC: 34.3 g/dL (ref 32.0–36.0)
MCV: 95.4 fL (ref 80.0–100.0)
MONOS PCT: 5 %
MPV: 10.2 fL (ref 7.5–12.5)
Monocytes Absolute: 360 cells/uL (ref 200–950)
NEUTROS PCT: 75 %
Neutro Abs: 5400 cells/uL (ref 1500–7800)
Platelets: 248 10*3/uL (ref 140–400)
RBC: 4.55 MIL/uL (ref 3.80–5.10)
RDW: 13 % (ref 11.0–15.0)
WBC: 7.2 10*3/uL (ref 3.8–10.8)

## 2016-05-07 LAB — HEMOGLOBIN A1C
HEMOGLOBIN A1C: 4.9 % (ref <5.7)
Mean Plasma Glucose: 94 mg/dL

## 2016-05-07 LAB — BASIC METABOLIC PANEL WITH GFR
BUN: 16 mg/dL (ref 7–25)
CALCIUM: 9.5 mg/dL (ref 8.6–10.4)
CO2: 23 mmol/L (ref 20–31)
CREATININE: 0.9 mg/dL (ref 0.50–1.05)
Chloride: 100 mmol/L (ref 98–110)
GFR, EST NON AFRICAN AMERICAN: 72 mL/min (ref >=60)
GFR, Est African American: 83 mL/min (ref >=60)
Glucose, Bld: 98 mg/dL (ref 65–99)
Potassium: 4 mmol/L (ref 3.5–5.3)
SODIUM: 136 mmol/L (ref 135–146)

## 2016-05-07 LAB — HEPATIC FUNCTION PANEL
ALT: 16 U/L (ref 6–29)
AST: 25 U/L (ref 10–35)
Albumin: 4.2 g/dL (ref 3.6–5.1)
Alkaline Phosphatase: 44 U/L (ref 33–130)
BILIRUBIN DIRECT: 0.1 mg/dL (ref <=0.2)
Indirect Bilirubin: 0.4 mg/dL (ref 0.2–1.2)
TOTAL PROTEIN: 6.2 g/dL (ref 6.1–8.1)
Total Bilirubin: 0.5 mg/dL (ref 0.2–1.2)

## 2016-05-07 LAB — LIPID PANEL
CHOLESTEROL: 181 mg/dL (ref 125–200)
HDL: 51 mg/dL (ref >=46)
LDL Cholesterol: 99 mg/dL (ref <130)
Total CHOL/HDL Ratio: 3.5 Ratio (ref <=5.0)
Triglycerides: 155 mg/dL — ABNORMAL HIGH (ref <150)
VLDL: 31 mg/dL — ABNORMAL HIGH (ref <30)

## 2016-05-07 LAB — MAGNESIUM: MAGNESIUM: 1.6 mg/dL (ref 1.5–2.5)

## 2016-05-07 LAB — VITAMIN B12

## 2016-05-07 LAB — IRON AND TIBC
%SAT: 18 % (ref 11–50)
IRON: 74 ug/dL (ref 45–160)
TIBC: 420 ug/dL (ref 250–450)
UIBC: 346 ug/dL (ref 125–400)

## 2016-05-07 LAB — TSH: TSH: 1.06 m[IU]/L

## 2016-05-07 NOTE — Progress Notes (Signed)
[facility]                   Lucky Cowboy, M.D.    Dyanne Carrel. Steffanie Dunn, P.A.-C      Terri Piedra, P.A.-C   [facility]                [address]                [place], [place]. [address] Telephone [phone] Telefax [phone]   Annual Screening/Preventative Visit And Comprehensive Evaluation &  Examination     This very nice 55 y.o.DWF presents for a Wellness/Preventative Visit & comprehensive evaluation and management of multiple medical co-morbidities.  Patient has been followed for labile HTN, screened for Prediabetes, Hyperlipidemia and Vitamin D Deficiency.     Patient has been treated for labile  HTN since 2009. Patient's BP has been controlled at home and patient denies any cardiac symptoms as chest pain, palpitations, shortness of breath, dizziness or ankle swelling. Today's BP: 112/72      Patient's lipids are controlled with diet and exercise - usu 5-6 days/week.  Last lipids were at goal.  Lab Results  Component Value Date   CHOL 168 04/18/2015   HDL 35 (L) 04/18/2015   LDLCALC 109 (H) 04/18/2015   TRIG 118 04/18/2015   CHOLHDL 4.8 04/18/2015      Patient is monitored expectantly for prediabetes and patient denies reactive hypoglycemic symptoms, visual blurring, diabetic polys, or paresthesias. Last A1c was at goal.  Lab Results  Component Value Date   HGBA1C 5.1 04/18/2015      Finally, patient has history of Vitamin D Deficiency and last Vitamin D was at goal: Lab Results  Component Value Date   VD25OH 84 04/18/2015   Current Outpatient Prescriptions on File Prior to Visit  Medication Sig  . ALPRAZolam (XANAX) 0.5 MG tablet Take 1 tablet (0.5 mg total) by mouth 3 (three) times daily as needed.  . bisoprolol-hydrochlorothiazide (ZIAC) 10-6.25 MG tablet TAKE 1/2- 1 TABLET BY MOUTH EVERY DAY FOR BLOOD PRESSURE  . SUMAtriptan (IMITREX) 100 MG tablet TAKE 1 TABLET BY MOUTH ONCE AS  NEEDED FOR MIGRAINE. MAY REPEAT IN 2 HOURS IF HEADACHE PERSISTS OR RECURS.  . traZODone (DESYREL) 150 MG tablet TAKE 1/2 TO 1 TABLET BY MOUTH EVERY NIGHT AT BEDTIME AS NEEDED FOR SLEEP   No current facility-administered medications on file prior to visit.    Allergies  Allergen Reactions  . Crestor [Rosuvastatin]   . Penicillins Rash  . Sulfa Antibiotics Rash   Past Medical History:  Diagnosis Date  . HSV-2 (herpes simplex virus 2) infection   . Hypertension   . Multiple body piercings    one in tongue-will take out for surgery-puts a plastic piece in  . Vitamin B 12 deficiency    Health Maintenance  Topic Date Due  . COLONOSCOPY  07/30/2009  . MAMMOGRAM  06/12/2015  . INFLUENZA VACCINE  05/07/2016  . PAP SMEAR  06/02/2018  . TETANUS/TDAP  02/08/2019  . Hepatitis C Screening  Completed  . HIV Screening  Completed   Immunization History  Administered Date(s) Administered  . PPD Test 04/14/2014, 04/18/2015  . Tdap 02/07/2009   Past Surgical History:  Procedure Laterality Date  . ORIF ANKLE FRACTURE  11/05/2012   Procedure: OPEN REDUCTION INTERNAL FIXATION (ORIF) ANKLE FRACTURE;  Surgeon: Toni Arthurs, MD;  Location: [facility];  Service: Orthopedics;  Laterality: Left;  open reduction internal fixation LEFT ANKLE TRIMALLEOLAR FRACTURE    Family History  Problem Relation Age of Onset  . COPD Mother   . Hypertension Father   . Diabetes Father    Social History  Substance Use Topics  . Smoking status: Never Smoker  . Smokeless tobacco: Not on file  . Alcohol use 0.6 oz/week    1 Standard drinks or equivalent per week     Comment: occ    ROS Constitutional: Denies fever, chills, weight loss/gain, headaches, insomnia,  night sweats, and change in appetite. Does c/o fatigue. Eyes: Denies redness, blurred vision, diplopia, discharge, itchy, watery eyes.  ENT: Denies discharge, congestion, post nasal drip, epistaxis, sore throat, earache, hearing loss,  dental pain, Tinnitus, Vertigo, Sinus pain, snoring.  Cardio: Denies chest pain, palpitations, irregular heartbeat, syncope, dyspnea, diaphoresis, orthopnea, PND, claudication, edema Respiratory: denies cough, dyspnea, DOE, pleurisy, hoarseness, laryngitis, wheezing.  Gastrointestinal: Denies dysphagia, heartburn, reflux, water brash, pain, cramps, nausea, vomiting, bloating, diarrhea, constipation, hematemesis, melena, hematochezia, jaundice, hemorrhoids Genitourinary: Denies dysuria, frequency, urgency, nocturia, hesitancy, discharge, hematuria, flank pain Breast: Breast lumps, nipple discharge, bleeding.  Musculoskeletal: Denies arthralgia, myalgia, stiffness, Jt. Swelling, pain, limp, and strain/sprain. Denies falls. Skin: Denies puritis, rash, hives, warts, acne, eczema, changing in skin lesion Neuro: No weakness, tremor, incoordination, spasms, paresthesia, pain Psychiatric: Denies confusion, memory loss, sensory loss. Denies Depression. Endocrine: Denies change in weight, skin, hair change, nocturia, and paresthesia, diabetic polys, visual blurring, hyper / hypo glycemic episodes.  Heme/Lymph: No excessive bleeding, bruising, enlarged lymph nodes.  Physical Exam  BP 112/72   Pulse (!) 56   Temp 97.5 F (36.4 C)   Resp 16   Ht 5' 6.5" (1.689 m)   Wt 129 lb 6.4 oz (58.7 kg)   BMI 20.57 kg/m   General Appearance: Well nourished and in no apparent distress. Eyes: PERRLA, EOMs, conjunctiva no swelling or erythema, normal fundi and vessels. Sinuses: No frontal/maxillary tenderness ENT/Mouth: EACs patent / TMs  nl. Nares clear without erythema, swelling, mucoid exudates. Oral hygiene is good. No erythema, swelling, or exudate. Tongue normal, non-obstructing. Tonsils not swollen or erythematous. Hearing normal.  Neck: Supple, thyroid normal. No bruits, nodes or JVD. Respiratory: Respiratory effort normal.  BS equal and clear bilateral without rales, rhonci, wheezing or stridor. Cardio:  Heart sounds are normal with regular rate and rhythm and no murmurs, rubs or gallops. Peripheral pulses are normal and equal bilaterally without edema. No aortic or femoral bruits. Chest: symmetric with normal excursions and percussion. Breasts: Symmetric, without lumps, nipple discharge, retractions, or fibrocystic changes.  Abdomen: Flat, soft with bowel sounds active. Nontender, no guarding, rebound, hernias, masses, or organomegaly.  Lymphatics: Non tender without lymphadenopathy.  Genitourinary:  Musculoskeletal: Full ROM all peripheral extremities, joint stability, 5/5 strength, and normal gait. Skin: Warm and dry without rashes, lesions, cyanosis, clubbing or  ecchymosis.  Neuro: Cranial nerves intact, reflexes equal bilaterally. Normal muscle tone, no cerebellar symptoms. Sensation intact.  Pysch: Alert and oriented X 3, normal affect, Insight and Judgment appropriate.   Assessment and Plan  1. Annual Preventative Screening Examination  - Microalbumin / creatinine urine ratio - EKG 12-Lead - [place], RETROPERITNL ABD,  LTD - POC Hemoccult Bld/Stl  - Urinalysis, Routine w reflex microscopic  - Vitamin B12 - Iron and TIBC - CBC with Differential/Platelet - BASIC METABOLIC PANEL WITH GFR - Hepatic function panel - Magnesium - Lipid panel - TSH - Hemoglobin A1c - Insulin, random - VITAMIN D 25 Hydroxy  2. Essential hypertension  - Microalbumin / creatinine urine ratio - EKG 12-Lead - [place], RETROPERITNL ABD,  LTD - TSH  3. Elevated cholesterol  - Lipid panel - TSH  4. Abnormal glucose  - Hemoglobin A1c - Insulin, random  5. Vitamin D deficiency  - VITAMIN D 25 Hydroxy   6. Vitamin B 12 deficiency   7. Estrogen deficiency  - MICROGESTIN 1-20 MG-MCG tablet; TK 1 T PO QD; Refill: 3  8. Colon cancer screening  - POC Hemoccult Bld/Stl  9. Other fatigue  - Vitamin B12 - Iron and TIBC - CBC with Differential/Platelet - Hemoglobin A1c  10. Medication  management  - Urinalysis, Routine w reflex microscopic  - CBC with Differential/Platelet - BASIC METABOLIC PANEL WITH GFR - Hepatic function panel - Magnesium  11. Screening examination for pulmonary tuberculosis  - PPD   Continue prudent diet as discussed, weight control, BP monitoring, regular exercise, and medications. Discussed med's effects and SE's. Screening labs and tests as requested with regular follow-up as recommended. Over 40 minutes of exam, counseling, chart review and high complex critical decision making was performed.

## 2016-05-07 NOTE — Patient Instructions (Signed)
Preventive Care for Adults  A healthy lifestyle and preventive care can promote health and wellness. Preventive health guidelines for women include the following key practices.  A routine yearly physical is a good way to check with your health care provider about your health and preventive screening. It is a chance to share any concerns and updates on your health and to receive a thorough exam.  Visit your dentist for a routine exam and preventive care every 6 months. Brush your teeth twice a day and floss once a day. Good oral hygiene prevents tooth decay and gum disease.  The frequency of eye exams is based on your age, health, family medical history, use of contact lenses, and other factors. Follow your health care provider's recommendations for frequency of eye exams.  Eat a healthy diet. Foods like vegetables, fruits, whole grains, low-fat dairy products, and lean protein foods contain the nutrients you need without too many calories. Decrease your intake of foods high in solid fats, added sugars, and salt. Eat the right amount of calories for you.Get information about a proper diet from your health care provider, if necessary.  Regular physical exercise is one of the most important things you can do for your health. Most adults should get at least 150 minutes of moderate-intensity exercise (any activity that increases your heart rate and causes you to sweat) each week. In addition, most adults need muscle-strengthening exercises on 2 or more days a week.  Maintain a healthy weight. The body mass index (BMI) is a screening tool to identify possible weight problems. It provides an estimate of body fat based on height and weight. Your health care provider can find your BMI and can help you achieve or maintain a healthy weight.For adults 20 years and older:  A BMI below 18.5 is considered underweight.  A BMI of 18.5 to 24.9 is normal.  A BMI of 25 to 29.9 is considered overweight.  A BMI of  30 and above is considered obese.  Maintain normal blood lipids and cholesterol levels by exercising and minimizing your intake of saturated fat. Eat a balanced diet with plenty of fruit and vegetables. Blood tests for lipids and cholesterol should begin at age 20 and be repeated every 5 years. If your lipid or cholesterol levels are high, you are over 50, or you are at high risk for heart disease, you may need your cholesterol levels checked more frequently.Ongoing high lipid and cholesterol levels should be treated with medicines if diet and exercise are not working.  If you smoke, find out from your health care provider how to quit. If you do not use tobacco, do not start.  Lung cancer screening is recommended for adults aged 55-80 years who are at high risk for developing lung cancer because of a history of smoking. A yearly low-dose CT scan of the lungs is recommended for people who have at least a 30-pack-year history of smoking and are a current smoker or have quit within the past 15 years. A pack year of smoking is smoking an average of 1 pack of cigarettes a day for 1 year (for example: 1 pack a day for 30 years or 2 packs a day for 15 years). Yearly screening should continue until the smoker has stopped smoking for at least 15 years. Yearly screening should be stopped for people who develop a health problem that would prevent them from having lung cancer treatment.  High blood pressure causes heart disease and increases the risk of   stroke. Your blood pressure should be checked at least every 1 to 2 years. Ongoing high blood pressure should be treated with medicines if weight loss and exercise do not work.  If you are 55-79 years old, ask your health care provider if you should take aspirin to prevent strokes.  Diabetes screening involves taking a blood sample to check your fasting blood sugar level. This should be done once every 3 years, after age 45, if you are within normal weight and  without risk factors for diabetes. Testing should be considered at a younger age or be carried out more frequently if you are overweight and have at least 1 risk factor for diabetes.  Breast cancer screening is essential preventive care for women. You should practice "breast self-awareness." This means understanding the normal appearance and feel of your breasts and may include breast self-examination. Any changes detected, no matter how small, should be reported to a health care provider. Women in their 20s and 30s should have a clinical breast exam (CBE) by a health care provider as part of a regular health exam every 1 to 3 years. After age 40, women should have a CBE every year. Starting at age 40, women should consider having a mammogram (breast X-ray test) every year. Women who have a family history of breast cancer should talk to their health care provider about genetic screening. Women at a high risk of breast cancer should talk to their health care providers about having an MRI and a mammogram every year.  Breast cancer gene (BRCA)-related cancer risk assessment is recommended for women who have family members with BRCA-related cancers. BRCA-related cancers include breast, ovarian, tubal, and peritoneal cancers. Having family members with these cancers may be associated with an increased risk for harmful changes (mutations) in the breast cancer genes BRCA1 and BRCA2. Results of the assessment will determine the need for genetic counseling and BRCA1 and BRCA2 testing.  Routine pelvic exams to screen for cancer are no longer recommended for nonpregnant women who are considered low risk for cancer of the pelvic organs (ovaries, uterus, and vagina) and who do not have symptoms. Ask your health care provider if a screening pelvic exam is right for you.  If you have had past treatment for cervical cancer or a condition that could lead to cancer, you need Pap tests and screening for cancer for at least 20  years after your treatment. If Pap tests have been discontinued, your risk factors (such as having a new sexual partner) need to be reassessed to determine if screening should be resumed. Some women have medical problems that increase the chance of getting cervical cancer. In these cases, your health care provider may recommend more frequent screening and Pap tests.  Colorectal cancer can be detected and often prevented. Most routine colorectal cancer screening begins at the age of 50 years and continues through age 75 years. However, your health care provider may recommend screening at an earlier age if you have risk factors for colon cancer. On a yearly basis, your health care provider may provide home test kits to check for hidden blood in the stool. Use of a small camera at the end of a tube, to directly examine the colon (sigmoidoscopy or colonoscopy), can detect the earliest forms of colorectal cancer. Talk to your health care provider about this at age 50, when routine screening begins. Direct exam of the colon should be repeated every 5-10 years through age 75 years, unless early forms of pre-cancerous   polyps or small growths are found.  Hepatitis C blood testing is recommended for all people born from 1945 through 1965 and any individual with known risks for hepatitis C.  Pra  Osteoporosis is a disease in which the bones lose minerals and strength with aging. This can result in serious bone fractures or breaks. The risk of osteoporosis can be identified using a bone density scan. Women ages 65 years and over and women at risk for fractures or osteoporosis should discuss screening with their health care providers. Ask your health care provider whether you should take a calcium supplement or vitamin D to reduce the rate of osteoporosis.  Menopause can be associated with physical symptoms and risks. Hormone replacement therapy is available to decrease symptoms and risks. You should talk to your  health care provider about whether hormone replacement therapy is right for you.  Use sunscreen. Apply sunscreen liberally and repeatedly throughout the day. You should seek shade when your shadow is shorter than you. Protect yourself by wearing long sleeves, pants, a wide-brimmed hat, and sunglasses year round, whenever you are outdoors.  Once a month, do a whole body skin exam, using a mirror to look at the skin on your back. Tell your health care provider of new moles, moles that have irregular borders, moles that are larger than a pencil eraser, or moles that have changed in shape or color.  Stay current with required vaccines (immunizations).  Influenza vaccine. All adults should be immunized every year.  Tetanus, diphtheria, and acellular pertussis (Td, Tdap) vaccine. Pregnant women should receive 1 dose of Tdap vaccine during each pregnancy. The dose should be obtained regardless of the length of time since the last dose. Immunization is preferred during the 27th-36th week of gestation. An adult who has not previously received Tdap or who does not know her vaccine status should receive 1 dose of Tdap. This initial dose should be followed by tetanus and diphtheria toxoids (Td) booster doses every 10 years. Adults with an unknown or incomplete history of completing a 3-dose immunization series with Td-containing vaccines should begin or complete a primary immunization series including a Tdap dose. Adults should receive a Td booster every 10 years.  Varicella vaccine. An adult without evidence of immunity to varicella should receive 2 doses or a second dose if she has previously received 1 dose. Pregnant females who do not have evidence of immunity should receive the first dose after pregnancy. This first dose should be obtained before leaving the health care facility. The second dose should be obtained 4-8 weeks after the first dose.  Human papillomavirus (HPV) vaccine. Females aged 13-26 years  who have not received the vaccine previously should obtain the 3-dose series. The vaccine is not recommended for use in pregnant females. However, pregnancy testing is not needed before receiving a dose. If a female is found to be pregnant after receiving a dose, no treatment is needed. In that case, the remaining doses should be delayed until after the pregnancy. Immunization is recommended for any person with an immunocompromised condition through the age of 26 years if she did not get any or all doses earlier. During the 3-dose series, the second dose should be obtained 4-8 weeks after the first dose. The third dose should be obtained 24 weeks after the first dose and 16 weeks after the second dose.  Zoster vaccine. One dose is recommended for adults aged 60 years or older unless certain conditions are present.  Measles, mumps, and rubella (  MMR) vaccine. Adults born before [birth year] generally are considered immune to measles and mumps. Adults born in [birth year] or later should have 1 or more doses of MMR vaccine unless there is a contraindication to the vaccine or there is laboratory evidence of immunity to each of the three diseases. A routine second dose of MMR vaccine should be obtained at least 28 days after the first dose for students attending postsecondary schools, health care workers, or international travelers. People who received inactivated measles vaccine or an unknown type of measles vaccine during 1963-1967 should receive 2 doses of MMR vaccine. People who received inactivated mumps vaccine or an unknown type of mumps vaccine before 1979 and are at high risk for mumps infection should consider immunization with 2 doses of MMR vaccine. For females of childbearing age, rubella immunity should be determined. If there is no evidence of immunity, females who are not pregnant should be vaccinated. If there is no evidence of immunity, females who are pregnant should delay immunization until after pregnancy.  Unvaccinated health care workers born before [birth year] who lack laboratory evidence of measles, mumps, or rubella immunity or laboratory confirmation of disease should consider measles and mumps immunization with 2 doses of MMR vaccine or rubella immunization with 1 dose of MMR vaccine.  Pneumococcal 13-valent conjugate (PCV13) vaccine. When indicated, a person who is uncertain of her immunization history and has no record of immunization should receive the PCV13 vaccine. An adult aged 39 years or older who has certain medical conditions and has not been previously immunized should receive 1 dose of PCV13 vaccine. This PCV13 should be followed with a dose of pneumococcal polysaccharide (PPSV23) vaccine. The PPSV23 vaccine dose should be obtained at least 8 weeks after the dose of PCV13 vaccine. An adult aged 62 years or older who has certain medical conditions and previously received 1 or more doses of PPSV23 vaccine should receive 1 dose of PCV13. The PCV13 vaccine dose should be obtained 1 or more years after the last PPSV23 vaccine dose.    Pneumococcal polysaccharide (PPSV23) vaccine. When PCV13 is also indicated, PCV13 should be obtained first. All adults aged 67 years and older should be immunized. An adult younger than age 45 years who has certain medical conditions should be immunized. Any person who resides in a nursing home or long-term care facility should be immunized. An adult smoker should be immunized. People with an immunocompromised condition and certain other conditions should receive both PCV13 and PPSV23 vaccines. People with human immunodeficiency virus (HIV) infection should be immunized as soon as possible after diagnosis. Immunization during chemotherapy or radiation therapy should be avoided. Routine use of PPSV23 vaccine is not recommended for American Indians, [place] Natives, or people younger than 65 years unless there are medical conditions that require PPSV23 vaccine. When indicated,  people who have unknown immunization and have no record of immunization should receive PPSV23 vaccine. One-time revaccination 5 years after the first dose of PPSV23 is recommended for people aged 19-64 years who have chronic kidney failure, nephrotic syndrome, asplenia, or immunocompromised conditions. People who received 1-2 doses of PPSV23 before age 23 years should receive another dose of PPSV23 vaccine at age 35 years or later if at least 5 years have passed since the previous dose. Doses of PPSV23 are not needed for people immunized with PPSV23 at or after age 38 years.  Preventive Services / Frequency   Ages 43 to 86 years  Blood pressure check.  Lipid and cholesterol check.  Lung  cancer screening. / Every year if you are aged 55-80 years and have a 30-pack-year history of smoking and currently smoke or have quit within the past 15 years. Yearly screening is stopped once you have quit smoking for at least 15 years or develop a health problem that would prevent you from having lung cancer treatment.  Clinical breast exam.** / Every year after age 40 years.  BRCA-related cancer risk assessment.** / For women who have family members with a BRCA-related cancer (breast, ovarian, tubal, or peritoneal cancers).  Mammogram.** / Every year beginning at age 40 years and continuing for as long as you are in good health. Consult with your health care provider.  Pap test.** / Every 3 years starting at age 30 years through age 65 or 70 years with a history of 3 consecutive normal Pap tests.  HPV screening.** / Every 3 years from ages 30 years through ages 65 to 70 years with a history of 3 consecutive normal Pap tests.  Fecal occult blood test (FOBT) of stool. / Every year beginning at age 50 years and continuing until age 75 years. You may not need to do this test if you get a colonoscopy every 10 years.  Flexible sigmoidoscopy or colonoscopy.** / Every 5 years for a flexible sigmoidoscopy or  every 10 years for a colonoscopy beginning at age 50 years and continuing until age 75 years.  Hepatitis C blood test.** / For all people born from 1945 through 1965 and any individual with known risks for hepatitis C.  Skin self-exam. / Monthly.  Influenza vaccine. / Every year.  Tetanus, diphtheria, and acellular pertussis (Tdap/Td) vaccine.** / Consult your health care provider. Pregnant women should receive 1 dose of Tdap vaccine during each pregnancy. 1 dose of Td every 10 years.  Varicella vaccine.** / Consult your health care provider. Pregnant females who do not have evidence of immunity should receive the first dose after pregnancy.  Zoster vaccine.** / 1 dose for adults aged 60 years or older.  Pneumococcal 13-valent conjugate (PCV13) vaccine.** / Consult your health care provider.  Pneumococcal polysaccharide (PPSV23) vaccine.** / 1 to 2 doses if you smoke cigarettes or if you have certain conditions.  Meningococcal vaccine.** / Consult your health care provider.  Hepatitis A vaccine.** / Consult your health care provider.  Hepatitis B vaccine.** / Consult your health care provider. Screening for abdominal aortic aneurysm (AAA)  by ultrasound is recommended for people over 50 who have history of high blood pressure or who are current or former smokers. ++++++++++++++++++ Recommend Adult Low Dose Aspirin or  coated  Aspirin 81 mg daily  To reduce risk of Colon Cancer 20 %,  Skin Cancer 26 % ,  Melanoma 46%  and  Pancreatic cancer 60% +++++++++++++++++++ Vitamin D goal  is between 70-100.  Please make sure that you are taking your Vitamin D as directed.  It is very important as a natural anti-inflammatory  helping hair, skin, and nails, as well as reducing stroke and heart attack risk.  It helps your bones and helps with mood. It also decreases numerous cancer risks so please take it as directed.  Low Vit D is associated with a 200-300% higher risk for CANCER  and  200-300% higher risk for HEART   ATTACK  &  STROKE.   ...................................... It is also associated with higher death rate at younger ages,  autoimmune diseases like Rheumatoid arthritis, Lupus, Multiple Sclerosis.    Also many other serious conditions, like depression,   Alzheimer's Dementia, infertility, muscle aches, fatigue, fibromyalgia - just to name a few. ++++++++++++++++++ Recommend the book "The END of DIETING" by Dr Joel Fuhrman  & the book "The END of DIABETES " by Dr Joel Fuhrman At [employer] - get book & Audio CD's    Being diabetic has a  300% increased risk for heart attack, stroke, cancer, and alzheimer- type vascular dementia. It is very important that you work harder with diet by avoiding all foods that are white. Avoid white rice (brown & wild rice is OK), white potatoes (sweetpotatoes in moderation is OK), White bread or wheat bread or anything made out of white flour like bagels, donuts, rolls, buns, biscuits, cakes, pastries, cookies, pizza crust, and pasta (made from white flour & egg whites) - vegetarian pasta or spinach or wheat pasta is OK. Multigrain breads like Arnold's or [employer], or multigrain sandwich thins or flatbreads.  Diet, exercise and weight loss can reverse and cure diabetes in the early stages.  Diet, exercise and weight loss is very important in the control and prevention of complications of diabetes which affects every system in your body, ie. Brain - dementia/stroke, eyes - glaucoma/blindness, heart - heart attack/heart failure, kidneys - dialysis, stomach - gastric paralysis, intestines - malabsorption, nerves - severe painful neuritis, circulation - gangrene & loss of a leg(s), and finally cancer and Alzheimers.    I recommend avoid fried & greasy foods,  sweets/candy, white rice (brown or wild rice or Quinoa is OK), white potatoes (sweet potatoes are OK) - anything made from white flour - bagels, doughnuts, rolls, buns, biscuits,white  and wheat breads, pizza crust and traditional pasta made of white flour & egg white(vegetarian pasta or spinach or wheat pasta is OK).  Multi-grain bread is OK - like multi-grain flat bread or sandwich thins. Avoid alcohol in excess. Exercise is also important.    Eat all the vegetables you want - avoid meat, especially red meat and dairy - especially cheese.  Cheese is the most concentrated form of trans-fats which is the worst thing to clog up our arteries. Veggie cheese is OK which can be found in the fresh produce section at [employer] or [employer] or [employer]  ++++++++++++++++++++++ DASH Eating Plan  DASH stands for "Dietary Approaches to Stop Hypertension."   The DASH eating plan is a healthy eating plan that has been shown to reduce high blood pressure (hypertension). Additional health benefits may include reducing the risk of type 2 diabetes mellitus, heart disease, and stroke. The DASH eating plan may also help with weight loss. WHAT DO I NEED TO KNOW ABOUT THE DASH EATING PLAN? For the DASH eating plan, you will follow these general guidelines:  Choose foods with a percent daily value for sodium of less than 5% (as listed on the food label).  Use salt-free seasonings or herbs instead of table salt or sea salt.  Check with your health care provider or pharmacist before using salt substitutes.  Eat lower-sodium products, often labeled as "lower sodium" or "no salt added."  Eat fresh foods.  Eat more vegetables, fruits, and low-fat dairy products.  Choose whole grains. Look for the word "whole" as the first word in the ingredient list.  Choose fish   Limit sweets, desserts, sugars, and sugary drinks.  Choose heart-healthy fats.  Eat veggie cheese   Eat more home-cooked food and less restaurant, buffet, and fast food.  Limit fried foods.  Cook foods using methods other than frying.  Limit canned   vegetables. If you do use them, rinse them well to decrease the  sodium.  When eating at a restaurant, ask that your food be prepared with less salt, or no salt if possible.                      WHAT FOODS CAN I EAT? Read Dr Joel Fuhrman's books on The End of Dieting & The End of Diabetes  Grains Whole grain or whole wheat bread. Brown rice. Whole grain or whole wheat pasta. Quinoa, bulgur, and whole grain cereals. Low-sodium cereals. Corn or whole wheat flour tortillas. Whole grain cornbread. Whole grain crackers. Low-sodium crackers.  Vegetables Fresh or frozen vegetables (raw, steamed, roasted, or grilled). Low-sodium or reduced-sodium tomato and vegetable juices. Low-sodium or reduced-sodium tomato sauce and paste. Low-sodium or reduced-sodium canned vegetables.   Fruits All fresh, canned (in natural juice), or frozen fruits.  Protein Products  All fish and seafood.  Dried beans, peas, or lentils. Unsalted nuts and seeds. Unsalted canned beans.  Dairy Low-fat dairy products, such as skim or 1% milk, 2% or reduced-fat cheeses, low-fat ricotta or cottage cheese, or plain low-fat yogurt. Low-sodium or reduced-sodium cheeses.  Fats and Oils Tub margarines without trans fats. Light or reduced-fat mayonnaise and salad dressings (reduced sodium). Avocado. Safflower, olive, or canola oils. Natural peanut or almond butter.  Other Unsalted popcorn and pretzels. The items listed above may not be a complete list of recommended foods or beverages. Contact your dietitian for more options.  ++++++++++++++++++  WHAT FOODS ARE NOT RECOMMENDED? Grains/ White flour or wheat flour White bread. White pasta. White rice. Refined cornbread. Bagels and croissants. Crackers that contain trans fat.  Vegetables  Creamed or fried vegetables. Vegetables in a . Regular canned vegetables. Regular canned tomato sauce and paste. Regular tomato and vegetable juices.  Fruits Dried fruits. Canned fruit in light or heavy syrup. Fruit juice.  Meat and Other Protein  Products Meat in general - RED meat & White meat.  Fatty cuts of meat. Ribs, chicken wings, all processed meats as bacon, sausage, bologna, salami, fatback, hot dogs, bratwurst and packaged luncheon meats.  Dairy Whole or 2% milk, cream, half-and-half, and cream cheese. Whole-fat or sweetened yogurt. Full-fat cheeses or blue cheese. Non-dairy creamers and whipped toppings. Processed cheese, cheese spreads, or cheese curds.  Condiments Onion and garlic salt, seasoned salt, table salt, and sea salt. Canned and packaged gravies. Worcestershire sauce. Tartar sauce. Barbecue sauce. Teriyaki sauce. Soy sauce, including reduced sodium. Steak sauce. Fish sauce. Oyster sauce. Cocktail sauce. Horseradish. Ketchup and mustard. Meat flavorings and tenderizers. Bouillon cubes. Hot sauce. Tabasco sauce. Marinades. Taco seasonings. Relishes.  Fats and Oils Butter, stick margarine, lard, shortening and bacon fat. Coconut, palm kernel, or palm oils. Regular salad dressings.  Pickles and olives. Salted popcorn and pretzels.  The items listed above may not be a complete list of foods and beverages to avoid.    

## 2016-05-08 LAB — VITAMIN D 25 HYDROXY (VIT D DEFICIENCY, FRACTURES): VIT D 25 HYDROXY: 97 ng/mL (ref 30–100)

## 2016-05-08 LAB — MICROALBUMIN / CREATININE URINE RATIO
CREATININE, URINE: 56 mg/dL (ref 20–320)
Microalb, Ur: <0.2 mg/dL

## 2016-05-08 LAB — URINALYSIS, ROUTINE W REFLEX MICROSCOPIC
Bilirubin Urine: NEGATIVE
Glucose, UA: NEGATIVE
Hgb urine dipstick: NEGATIVE
KETONES UR: NEGATIVE
Leukocytes, UA: NEGATIVE
NITRITE: NEGATIVE
PH: 6 (ref 5.0–8.0)
Protein, ur: NEGATIVE
SPECIFIC GRAVITY, URINE: 1.015 (ref 1.001–1.035)

## 2016-05-08 LAB — INSULIN, RANDOM: INSULIN: 10.7 u[IU]/mL (ref 2.0–19.6)

## 2016-05-09 ENCOUNTER — Encounter: Payer: Self-pay | Admitting: Internal Medicine

## 2016-05-09 LAB — TB SKIN TEST
Induration: 0 mm
TB Skin Test: NEGATIVE

## 2016-05-30 DIAGNOSIS — R87610 Atypical squamous cells of undetermined significance on cytologic smear of cervix (ASC-US): Secondary | ICD-10-CM | POA: Diagnosis not present

## 2016-05-30 DIAGNOSIS — Z113 Encounter for screening for infections with a predominantly sexual mode of transmission: Secondary | ICD-10-CM | POA: Diagnosis not present

## 2016-05-30 DIAGNOSIS — Z01419 Encounter for gynecological examination (general) (routine) without abnormal findings: Secondary | ICD-10-CM | POA: Diagnosis not present

## 2016-05-30 DIAGNOSIS — Z124 Encounter for screening for malignant neoplasm of cervix: Secondary | ICD-10-CM | POA: Diagnosis not present

## 2016-05-30 DIAGNOSIS — N959 Unspecified menopausal and perimenopausal disorder: Secondary | ICD-10-CM | POA: Diagnosis not present

## 2016-05-30 DIAGNOSIS — Z1389 Encounter for screening for other disorder: Secondary | ICD-10-CM | POA: Diagnosis not present

## 2016-05-30 DIAGNOSIS — Z1231 Encounter for screening mammogram for malignant neoplasm of breast: Secondary | ICD-10-CM | POA: Diagnosis not present

## 2016-05-30 DIAGNOSIS — Z6820 Body mass index (BMI) 20.0-20.9, adult: Secondary | ICD-10-CM | POA: Diagnosis not present

## 2016-05-30 DIAGNOSIS — Z1151 Encounter for screening for human papillomavirus (HPV): Secondary | ICD-10-CM | POA: Diagnosis not present

## 2016-06-06 ENCOUNTER — Other Ambulatory Visit: Payer: Self-pay | Admitting: Obstetrics and Gynecology

## 2016-06-06 DIAGNOSIS — R928 Other abnormal and inconclusive findings on diagnostic imaging of breast: Secondary | ICD-10-CM

## 2016-06-12 ENCOUNTER — Ambulatory Visit
Admission: RE | Admit: 2016-06-12 | Discharge: 2016-06-12 | Disposition: A | Discharge status: Home / Self Care | Payer: BLUE CROSS/BLUE SHIELD | Source: Ambulatory Visit | Attending: Obstetrics and Gynecology | Admitting: Obstetrics and Gynecology

## 2016-06-12 ENCOUNTER — Other Ambulatory Visit: Payer: Self-pay | Admitting: Obstetrics and Gynecology

## 2016-06-12 DIAGNOSIS — N63 Unspecified lump in breast: Secondary | ICD-10-CM | POA: Diagnosis not present

## 2016-06-12 DIAGNOSIS — R928 Other abnormal and inconclusive findings on diagnostic imaging of breast: Secondary | ICD-10-CM

## 2016-07-01 ENCOUNTER — Other Ambulatory Visit: Payer: Self-pay | Admitting: Internal Medicine

## 2016-07-10 DIAGNOSIS — N879 Dysplasia of cervix uteri, unspecified: Secondary | ICD-10-CM | POA: Diagnosis not present

## 2016-07-10 DIAGNOSIS — R87619 Unspecified abnormal cytological findings in specimens from cervix uteri: Secondary | ICD-10-CM | POA: Diagnosis not present

## 2016-08-19 ENCOUNTER — Other Ambulatory Visit: Payer: Self-pay | Admitting: Internal Medicine

## 2016-09-09 ENCOUNTER — Other Ambulatory Visit: Payer: Self-pay | Admitting: Internal Medicine

## 2016-09-30 ENCOUNTER — Other Ambulatory Visit: Payer: Self-pay | Admitting: Internal Medicine

## 2016-09-30 DIAGNOSIS — F411 Generalized anxiety disorder: Secondary | ICD-10-CM

## 2016-09-30 NOTE — Telephone Encounter (Signed)
Please call alpraz 

## 2016-10-02 ENCOUNTER — Other Ambulatory Visit: Payer: Self-pay | Admitting: Internal Medicine

## 2016-10-02 DIAGNOSIS — F411 Generalized anxiety disorder: Secondary | ICD-10-CM

## 2016-10-02 NOTE — Telephone Encounter (Signed)
Please call Alprazolam 

## 2016-11-11 ENCOUNTER — Other Ambulatory Visit: Payer: Self-pay | Admitting: Internal Medicine

## 2016-11-11 DIAGNOSIS — F411 Generalized anxiety disorder: Secondary | ICD-10-CM

## 2016-11-12 ENCOUNTER — Other Ambulatory Visit: Payer: Self-pay | Admitting: Internal Medicine

## 2016-11-12 DIAGNOSIS — F411 Generalized anxiety disorder: Secondary | ICD-10-CM

## 2016-11-12 NOTE — Telephone Encounter (Signed)
-   please call Alprazolam 

## 2016-12-02 ENCOUNTER — Other Ambulatory Visit: Payer: Self-pay | Admitting: Physician Assistant

## 2017-01-01 ENCOUNTER — Other Ambulatory Visit: Payer: Self-pay | Admitting: Internal Medicine

## 2017-01-01 DIAGNOSIS — F411 Generalized anxiety disorder: Secondary | ICD-10-CM

## 2017-01-01 NOTE — Telephone Encounter (Signed)
Please call alprazolam 

## 2017-02-16 ENCOUNTER — Other Ambulatory Visit: Payer: Self-pay | Admitting: Internal Medicine

## 2017-02-16 DIAGNOSIS — F411 Generalized anxiety disorder: Secondary | ICD-10-CM

## 2017-02-17 NOTE — Telephone Encounter (Signed)
Please call Alprazolam 

## 2017-03-20 ENCOUNTER — Other Ambulatory Visit: Payer: Self-pay | Admitting: Physician Assistant

## 2017-03-20 ENCOUNTER — Other Ambulatory Visit: Payer: Self-pay | Admitting: *Deleted

## 2017-03-20 MED ORDER — SUMATRIPTAN SUCCINATE 100 MG PO TABS: ORAL_TABLET | ORAL | 0 refills | Status: DC

## 2017-04-03 ENCOUNTER — Other Ambulatory Visit: Payer: Self-pay | Admitting: Internal Medicine

## 2017-04-03 DIAGNOSIS — F411 Generalized anxiety disorder: Secondary | ICD-10-CM

## 2017-04-04 NOTE — Telephone Encounter (Signed)
Xanax was called into pharmacy on 29th June 2018 @ 8:10am by DD

## 2017-05-19 ENCOUNTER — Other Ambulatory Visit: Payer: Self-pay | Admitting: Physician Assistant

## 2017-05-19 DIAGNOSIS — F411 Generalized anxiety disorder: Secondary | ICD-10-CM

## 2017-05-19 NOTE — Telephone Encounter (Signed)
Xanax called into pharmacy on 13th Aug 2018 at 10am by DD

## 2017-06-06 ENCOUNTER — Other Ambulatory Visit: Payer: Self-pay | Admitting: Internal Medicine

## 2017-06-16 ENCOUNTER — Encounter: Payer: Self-pay | Admitting: Internal Medicine

## 2017-06-18 ENCOUNTER — Encounter: Payer: Self-pay | Admitting: Internal Medicine

## 2017-06-18 IMAGING — MG 2D DIGITAL DIAGNOSTIC UNILATERAL LEFT MAMMOGRAM WITH CAD AND ADJ
3 series · 3 of 3 positions shown · non-contrast
Comparison: Previous exam(s).

CLINICAL DATA: Left breast lower inner quadrant nodule containing
calcifications seen on most recent screening mammography.

EXAM:
2D DIGITAL DIAGNOSTIC LEFT MAMMOGRAM WITH ADJUNCT TOMO
ULTRASOUND LEFT BREAST

[L ML (1 of 2)]
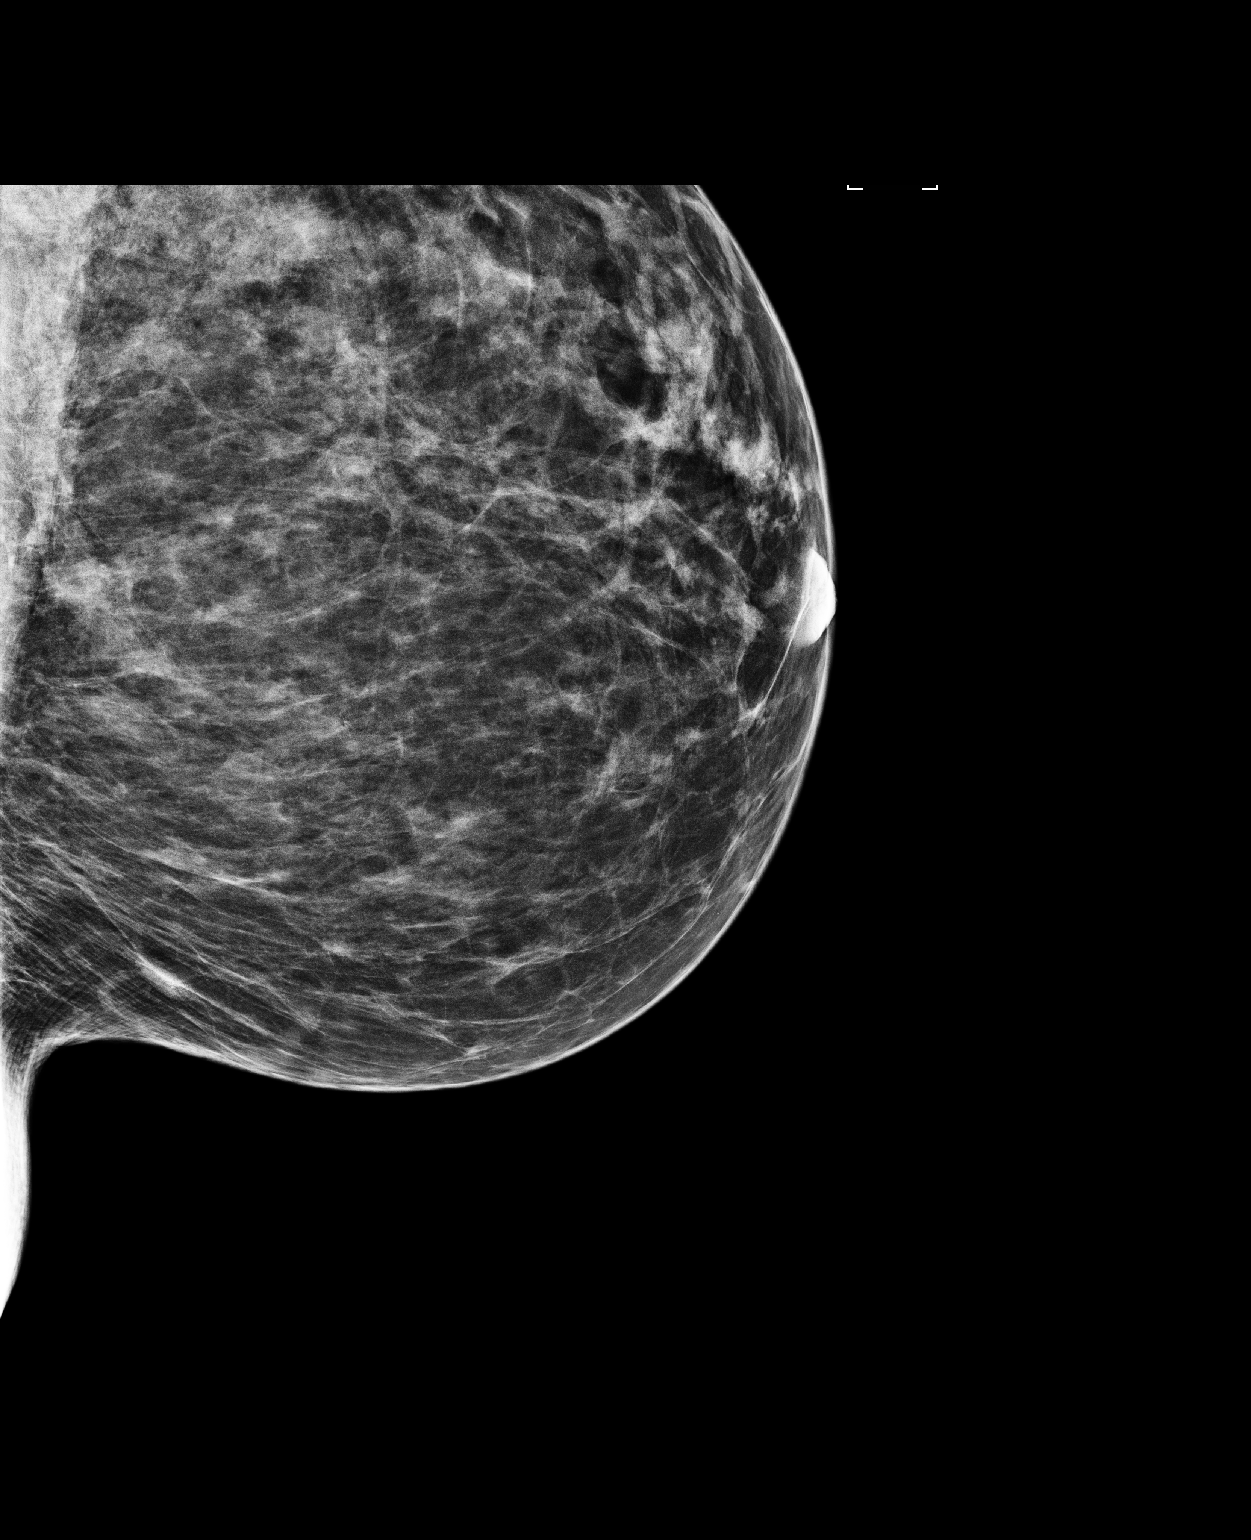

[L ML (2 of 2)]
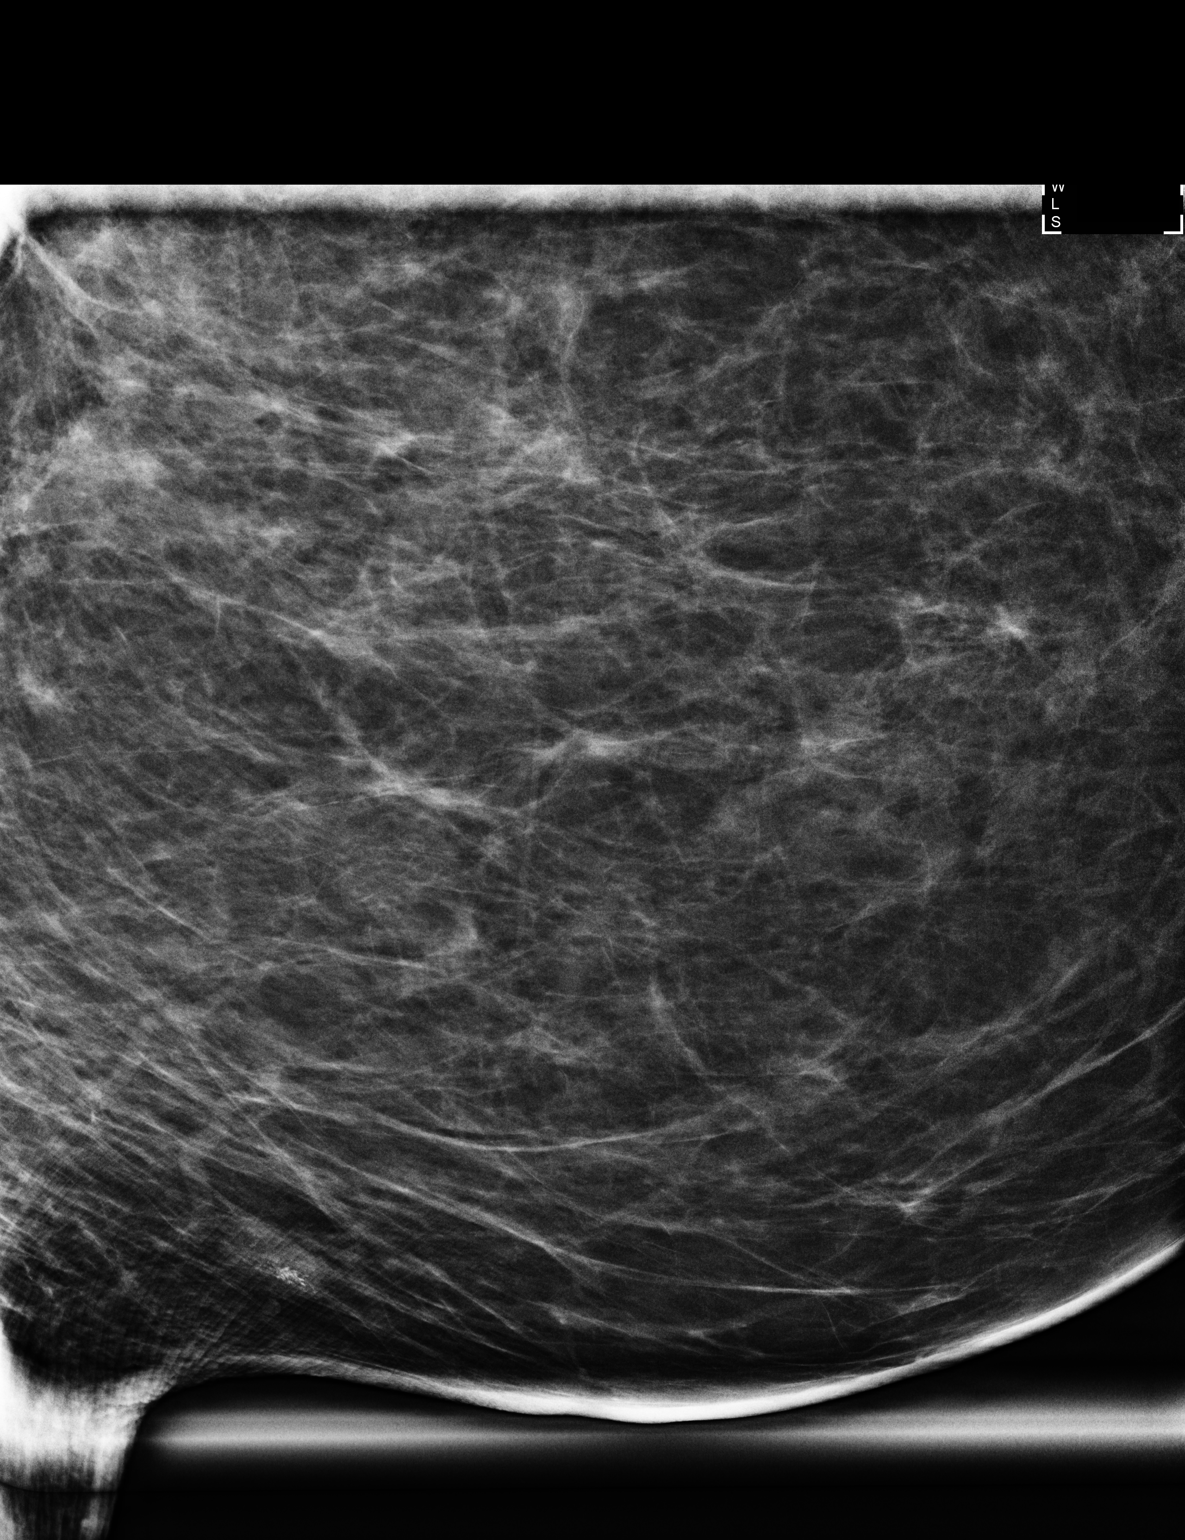

[L CC]
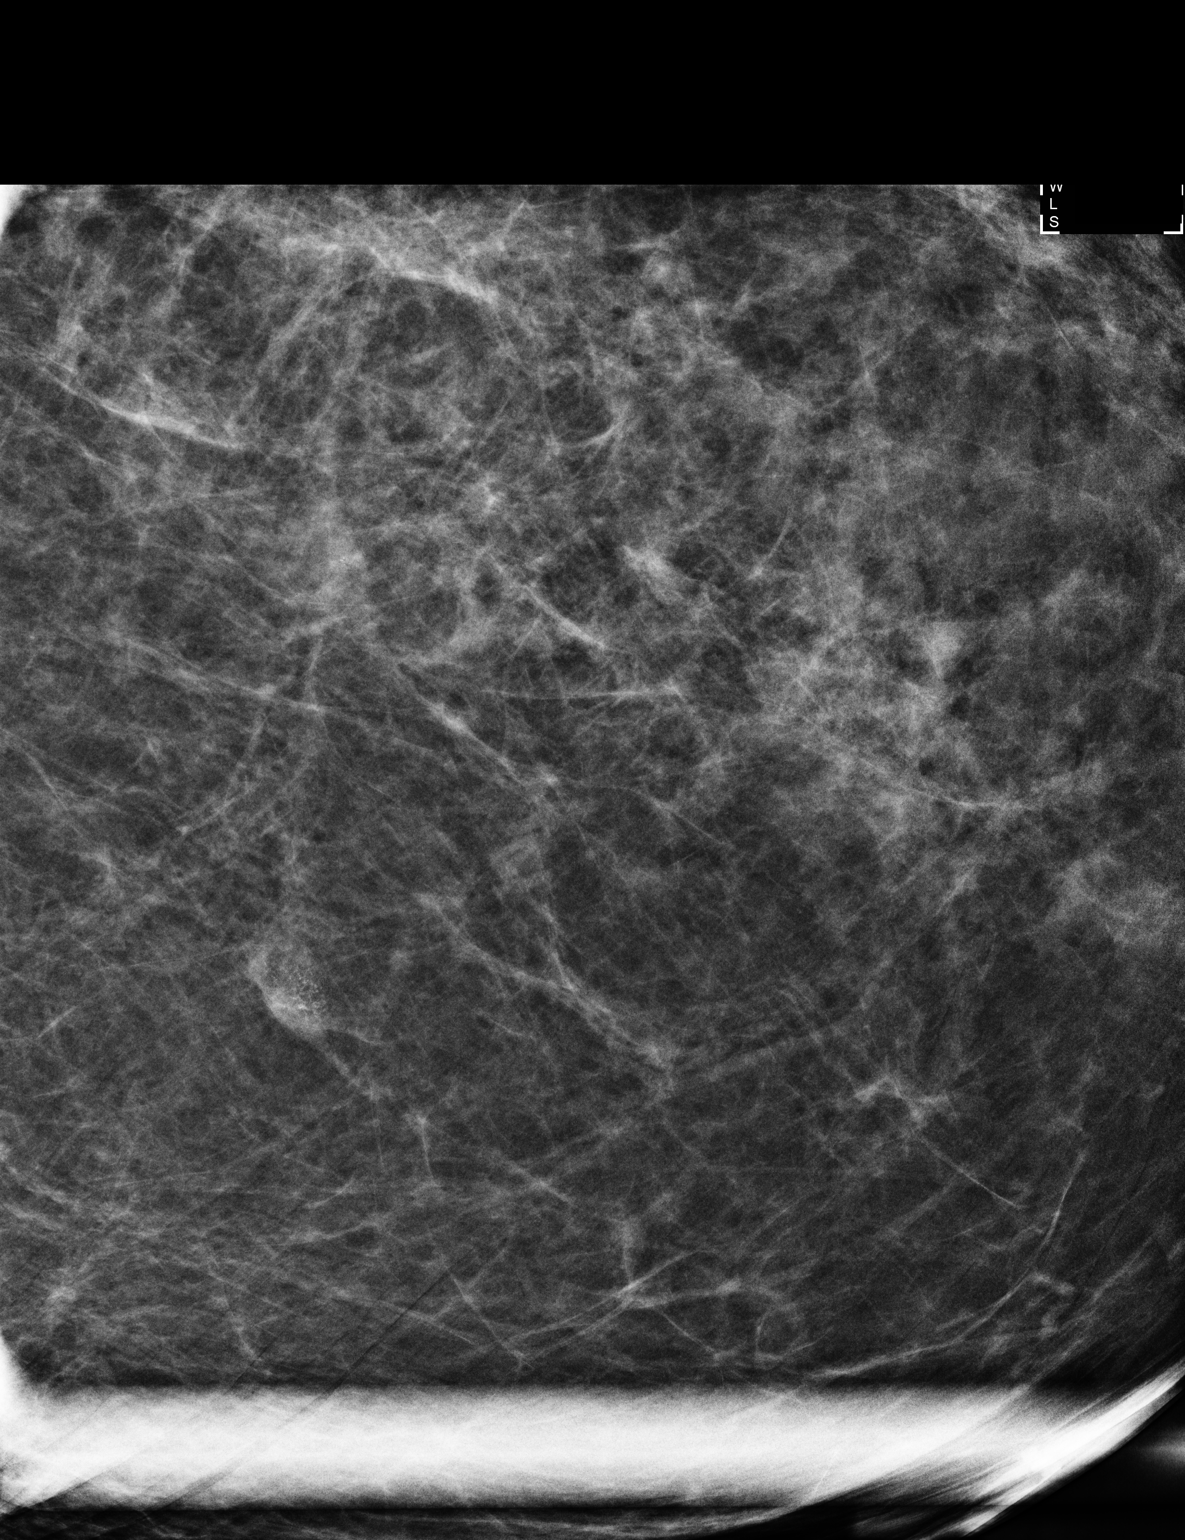

[3 of 3 positions shown; findings below may reference images not displayed]

ACR Breast Density Category b: There are scattered areas of
fibroglandular density.
FINDINGS: Additional mammographic views of the left breast demonstrate
persistent circumscribed low-density superficially located 9 mm
nodule containing round calcifications in the left breast lower
inner quadrant, posterior depth.

On physical exam, no suspicious masses are found.

Targeted ultrasound is performed, showing no suspicious masses or
shadowing lesions.
IMPRESSION: Benign-appearing left breast lower inner quadrant nodule, for which
six-month follow-up is recommended.

RECOMMENDATION:
Diagnostic mammogram and possibly ultrasound of the left breast in 6
months. (Code:[id number])

I have discussed the findings and recommendations with the patient.
Results were also provided in writing at the conclusion of the
visit. If applicable, a reminder letter will be sent to the patient
regarding the next appointment.

BI-RADS CATEGORY  3: Probably benign.

## 2017-06-24 DIAGNOSIS — Z113 Encounter for screening for infections with a predominantly sexual mode of transmission: Secondary | ICD-10-CM | POA: Diagnosis not present

## 2017-06-24 DIAGNOSIS — Z1231 Encounter for screening mammogram for malignant neoplasm of breast: Secondary | ICD-10-CM | POA: Diagnosis not present

## 2017-06-24 DIAGNOSIS — Z130 Encounter for screening for diseases of the blood and blood-forming organs and certain disorders involving the immune mechanism: Secondary | ICD-10-CM | POA: Diagnosis not present

## 2017-06-24 DIAGNOSIS — Z01419 Encounter for gynecological examination (general) (routine) without abnormal findings: Secondary | ICD-10-CM | POA: Diagnosis not present

## 2017-06-24 DIAGNOSIS — Z124 Encounter for screening for malignant neoplasm of cervix: Secondary | ICD-10-CM | POA: Diagnosis not present

## 2017-06-24 DIAGNOSIS — Z6821 Body mass index (BMI) 21.0-21.9, adult: Secondary | ICD-10-CM | POA: Diagnosis not present

## 2017-06-24 DIAGNOSIS — Z1389 Encounter for screening for other disorder: Secondary | ICD-10-CM | POA: Diagnosis not present

## 2017-06-24 DIAGNOSIS — Z3041 Encounter for surveillance of contraceptive pills: Secondary | ICD-10-CM | POA: Diagnosis not present

## 2017-06-24 DIAGNOSIS — Z1151 Encounter for screening for human papillomavirus (HPV): Secondary | ICD-10-CM | POA: Diagnosis not present

## 2017-07-01 ENCOUNTER — Ambulatory Visit (INDEPENDENT_AMBULATORY_CARE_PROVIDER_SITE_OTHER): Payer: BLUE CROSS/BLUE SHIELD | Admitting: Internal Medicine

## 2017-07-01 ENCOUNTER — Encounter: Payer: Self-pay | Admitting: Internal Medicine

## 2017-07-01 VITALS — BP 124/86 | HR 76 | Temp 97.7°F | Resp 18 | Ht 66.5 in | Wt 135.2 lb

## 2017-07-01 DIAGNOSIS — I10 Essential (primary) hypertension: Secondary | ICD-10-CM

## 2017-07-01 DIAGNOSIS — E559 Vitamin D deficiency, unspecified: Secondary | ICD-10-CM | POA: Diagnosis not present

## 2017-07-01 DIAGNOSIS — R7309 Other abnormal glucose: Secondary | ICD-10-CM

## 2017-07-01 DIAGNOSIS — Z0000 Encounter for general adult medical examination without abnormal findings: Secondary | ICD-10-CM | POA: Diagnosis not present

## 2017-07-01 DIAGNOSIS — Z1211 Encounter for screening for malignant neoplasm of colon: Secondary | ICD-10-CM

## 2017-07-01 DIAGNOSIS — Z111 Encounter for screening for respiratory tuberculosis: Secondary | ICD-10-CM | POA: Diagnosis not present

## 2017-07-01 DIAGNOSIS — E7800 Pure hypercholesterolemia, unspecified: Secondary | ICD-10-CM

## 2017-07-01 DIAGNOSIS — Z136 Encounter for screening for cardiovascular disorders: Secondary | ICD-10-CM | POA: Diagnosis not present

## 2017-07-01 DIAGNOSIS — R5383 Other fatigue: Secondary | ICD-10-CM

## 2017-07-01 DIAGNOSIS — Z0001 Encounter for general adult medical examination with abnormal findings: Secondary | ICD-10-CM

## 2017-07-01 DIAGNOSIS — Z79899 Other long term (current) drug therapy: Secondary | ICD-10-CM | POA: Diagnosis not present

## 2017-07-01 NOTE — Progress Notes (Signed)
[facility] Lucky Cowboy, M.D.     Dyanne Carrel. Steffanie Dunn, P.A.-C Judd Gaudier, DNP [facility] [address] [place], [place]. [address] Telephone [phone] Telefax [phone] Annual Screening/Preventative Visit & Comprehensive Evaluation &  Examination     This very nice 56 y.o. DWF presents for a Screening/Preventative Visit & comprehensive evaluation and management of multiple medical co-morbidities.  Patient has been followed for HTN, T2_NIDDM  Prediabetes, Hyperlipidemia and Vitamin D Deficiency. Patient also alludes some relationship issues and reactive depression, but adamantly declines taking meds as she felt she had difficulties coming off of Zoloft in the past. She indicates a desire for counseling or "self-help" resources.       Patient has labile HTN pretreated since 2009. Patient's BP has been controlled at home and patient denies any cardiac symptoms as chest pain, palpitations, shortness of breath, dizziness or ankle swelling. Today's BP is at goal - 124/86.      Patient's hyperlipidemia is controlled with diet and she exercises at a gym 5-6 days/week. Last lipids were  Lab Results  Component Value Date   CHOL 181 05/07/2016   HDL 51 05/07/2016   LDLCALC 99 05/07/2016   TRIG 155 (H) 05/07/2016   CHOLHDL 3.5 05/07/2016      Patient is monitored expectantly for  prediabetes and she denies reactive hypoglycemic symptoms, visual blurring, diabetic polys, or paresthesias. Last A1c was at goal.  Lab Results  Component Value Date   HGBA1C 4.9 05/07/2016      Finally, patient has history of Vitamin D Deficiency and last Vitamin D was at goal: Lab Results  Component Value Date   VD25OH 97 05/07/2016   Current Outpatient Prescriptions on File Prior to Visit  Medication Sig  . ALPRAZolam (XANAX) 0.5 MG tablet TAKE 1/2-1 TABLET BY MOUTH 2-3 TIMES PER DAY, ONLY FOR SEVERE ANXIETY  .  bisoprolol-hydrochlorothiazide (ZIAC) 10-6.25 MG tablet TAKE 1/2- 1 TABLET BY MOUTH EVERY DAY FOR BLOOD PRESSURE  . SUMAtriptan (IMITREX) 100 MG tablet TAKE 1 TABLET BY MOUTH 1 TIME AS NEEDED FOR MIGRAINE. MAY REPEAT IN 2 HOURS IF HEADACHE PERSISTS OR RECURS  . traZODone (DESYREL) 150 MG tablet TAKE 1/2 TO 1 TABLET BY MOUTH EVERY NIGHT AT BEDTIME AS NEEDED FOR SLEEP   No current facility-administered medications on file prior to visit.    Allergies  Allergen Reactions  . Crestor [Rosuvastatin]   . Penicillins Rash  . Sulfa Antibiotics Rash   Past Medical History:  Diagnosis Date  . HSV-2 (herpes simplex virus 2) infection   . Hypertension   . Multiple body piercings    one in tongue-will take out for surgery-puts a plastic piece in  . Vitamin B 12 deficiency    Health Maintenance  Topic Date Due  . COLONOSCOPY  07/30/2009  . MAMMOGRAM  06/12/2015  . INFLUENZA VACCINE  05/07/2017  . PAP SMEAR  06/02/2018  . TETANUS/TDAP  02/08/2019  . Hepatitis C Screening  Completed  . HIV Screening  Completed   Immunization History  Administered Date(s) Administered  . PPD Test 04/14/2014, 04/18/2015, 05/07/2016  . Tdap 02/07/2009   Past Surgical History:  Procedure Laterality Date  . ORIF ANKLE FRACTURE  11/05/2012   Procedure: OPEN REDUCTION INTERNAL FIXATION (ORIF) ANKLE FRACTURE;  Surgeon: Toni Arthurs, MD;  Location: [facility];  Service: Orthopedics;  Laterality: Left;  open reduction internal fixation LEFT ANKLE TRIMALLEOLAR FRACTURE    Family History  Problem Relation Age  of Onset  . COPD Mother   . Hypertension Father   . Diabetes Father    Social History  Substance Use Topics  . Smoking status: Never Smoker  . Smokeless tobacco: Not on file  . Alcohol use 0.6 oz/week    1 Standard drinks or equivalent per week     Comment: occ    ROS Constitutional: Denies fever, chills, weight loss/gain, headaches, insomnia,  night sweats, and change in appetite. Does  c/o fatigue. Eyes: Denies redness, blurred vision, diplopia, discharge, itchy, watery eyes.  ENT: Denies discharge, congestion, post nasal drip, epistaxis, sore throat, earache, hearing loss, dental pain, Tinnitus, Vertigo, Sinus pain, snoring.  Cardio: Denies chest pain, palpitations, irregular heartbeat, syncope, dyspnea, diaphoresis, orthopnea, PND, claudication, edema Respiratory: denies cough, dyspnea, DOE, pleurisy, hoarseness, laryngitis, wheezing.  Gastrointestinal: Denies dysphagia, heartburn, reflux, water brash, pain, cramps, nausea, vomiting, bloating, diarrhea, constipation, hematemesis, melena, hematochezia, jaundice, hemorrhoids Genitourinary: Denies dysuria, frequency, urgency, nocturia, hesitancy, discharge, hematuria, flank pain Breast: Breast lumps, nipple discharge, bleeding.  Musculoskeletal: Denies arthralgia, myalgia, stiffness, Jt. Swelling, pain, limp, and strain/sprain. Denies falls. Skin: Denies puritis, rash, hives, warts, acne, eczema, changing in skin lesion Neuro: No weakness, tremor, incoordination, spasms, paresthesia, pain Psychiatric: Denies confusion, memory loss, sensory loss. Denies Depression. Endocrine: Denies change in weight, skin, hair change, nocturia, and paresthesia, diabetic polys, visual blurring, hyper / hypo glycemic episodes.  Heme/Lymph: No excessive bleeding, bruising, enlarged lymph nodes.  Physical Exam  BP 124/86   Pulse 76   Temp 97.7 F (36.5 C)   Resp 18   Ht 5' 6.5" (1.689 m)   Wt 135 lb 3.2 oz (61.3 kg)   BMI 21.49 kg/m   General Appearance: Well nourished, well groomed and in no apparent distress.  Eyes: PERRLA, EOMs, conjunctiva no swelling or erythema, normal fundi and vessels. Sinuses: No frontal/maxillary tenderness ENT/Mouth: EACs patent / TMs  nl. Nares clear without erythema, swelling, mucoid exudates. Oral hygiene is good. No erythema, swelling, or exudate. Tongue normal, non-obstructing. Tonsils not swollen or  erythematous. Hearing normal.  Neck: Supple, thyroid normal. No bruits, nodes or JVD. Respiratory: Respiratory effort normal.  BS equal and clear bilateral without rales, rhonci, wheezing or stridor. Cardio: Heart sounds are normal with regular rate and rhythm and no murmurs, rubs or gallops. Peripheral pulses are normal and equal bilaterally without edema. No aortic or femoral bruits. Chest: symmetric with normal excursions and percussion. Breasts: Symmetric, without lumps, nipple discharge, retractions, or fibrocystic changes.  Abdomen: Flat, soft with bowel sounds active. Nontender, no guarding, rebound, hernias, masses, or organomegaly.  Lymphatics: Non tender without lymphadenopathy.  Genitourinary:  Musculoskeletal: Full ROM all peripheral extremities, joint stability, 5/5 strength, and normal gait. Skin: Warm and dry without rashes, lesions, cyanosis, clubbing or  ecchymosis.  Neuro: Cranial nerves intact, reflexes equal bilaterally. Normal muscle tone, no cerebellar symptoms. Sensation intact.  Pysch: Alert and oriented X 3, normal affect, Insight and Judgment appropriate.   Assessment and Plan  1. Annual Preventative Screening Examination  2. Essential hypertension  - EKG 12-Lead - Urinalysis, Routine w reflex microscopic - Microalbumin / creatinine urine ratio - CBC with Differential/Platelet - BASIC METABOLIC PANEL WITH GFR - Magnesium - TSH  3. Elevated cholesterol  - EKG 12-Lead - [place], RETROPERITNL ABD,  LTD - Lipid panel - TSH  4. Abnormal glucose  - Hemoglobin A1c - Insulin, random  5. Vitamin D deficiency  - VITAMIN D 25 Hydroxy   6. Colon cancer screening  - POC  Hemoccult Bld/Stl   7. Screening for ischemic heart disease  - EKG 12-Lead  8. Screening for AAA (aortic abdominal aneurysm)  - [place], RETROPERITNL ABD,  LTD  9. Screening examination for pulmonary tuberculosis  - PPD  10. Fatigue  - Iron,Total/Total Iron Binding Cap - Vitamin  B12 - CBC with Differential/Platelet - TSH  11. Medication management  - Urinalysis, Routine w reflex microscopic - Microalbumin / creatinine urine ratio - CBC with Differential/Platelet - BASIC METABOLIC PANEL WITH GFR - Hepatic function panel - Magnesium - Lipid panel - TSH - Hemoglobin A1c - Insulin, random - VITAMIN D 25 Hydroxy        Patient given reference of books for self-help. Patient was counseled in prudent diet to achieve/maintain BMI less than 25 for weight control, BP monitoring, regular exercise and medications. Discussed med's effects and SE's. Screening labs and tests as requested with regular follow-up as recommended. Over 40 minutes of exam, counseling, chart review and high complex critical decision making was performed.

## 2017-07-01 NOTE — Patient Instructions (Signed)

## 2017-07-02 ENCOUNTER — Other Ambulatory Visit: Payer: Self-pay | Admitting: Physician Assistant

## 2017-07-02 DIAGNOSIS — F411 Generalized anxiety disorder: Secondary | ICD-10-CM

## 2017-07-02 LAB — BASIC METABOLIC PANEL WITH GFR
BUN: 12 mg/dL (ref 7–25)
CALCIUM: 9.5 mg/dL (ref 8.6–10.4)
CO2: 29 mmol/L (ref 20–32)
CREATININE: 0.95 mg/dL (ref 0.50–1.05)
Chloride: 101 mmol/L (ref 98–110)
GFR, EST NON AFRICAN AMERICAN: 66 mL/min/{1.73_m2} (ref >=60)
GFR, Est African American: 77 mL/min/{1.73_m2} (ref >=60)
Glucose, Bld: 88 mg/dL (ref 65–99)
Potassium: 3.6 mmol/L (ref 3.5–5.3)
Sodium: 138 mmol/L (ref 135–146)

## 2017-07-02 LAB — URINALYSIS, ROUTINE W REFLEX MICROSCOPIC
Bilirubin Urine: NEGATIVE
Glucose, UA: NEGATIVE
HGB URINE DIPSTICK: NEGATIVE
Ketones, ur: NEGATIVE
LEUKOCYTES UA: NEGATIVE
NITRITE: NEGATIVE
PH: 6.5 (ref 5.0–8.0)
PROTEIN: NEGATIVE
Specific Gravity, Urine: 1.015 (ref 1.001–1.03)

## 2017-07-02 LAB — LIPID PANEL
Cholesterol: 192 mg/dL (ref <200)
HDL: 65 mg/dL (ref >50)
LDL CHOLESTEROL (CALC): 108 mg/dL — AB
NON-HDL CHOLESTEROL (CALC): 127 mg/dL (ref <130)
TRIGLYCERIDES: 93 mg/dL (ref <150)
Total CHOL/HDL Ratio: 3 (calc) (ref <5.0)

## 2017-07-02 LAB — IRON, TOTAL/TOTAL IRON BINDING CAP
%SAT: 26 % (calc) (ref 11–50)
Iron: 115 ug/dL (ref 45–160)
TIBC: 435 mcg/dL (calc) (ref 250–450)

## 2017-07-02 LAB — CBC WITH DIFFERENTIAL/PLATELET
BASOS ABS: 22 {cells}/uL (ref 0–200)
Basophils Relative: 0.4 %
EOS ABS: 92 {cells}/uL (ref 15–500)
Eosinophils Relative: 1.7 %
HEMATOCRIT: 43.5 % (ref 35.0–45.0)
HEMOGLOBIN: 15.1 g/dL (ref 11.7–15.5)
LYMPHS ABS: 972 {cells}/uL (ref 850–3900)
MCH: 32.5 pg (ref 27.0–33.0)
MCHC: 34.7 g/dL (ref 32.0–36.0)
MCV: 93.8 fL (ref 80.0–100.0)
MONOS PCT: 6.3 %
MPV: 10.4 fL (ref 7.5–12.5)
NEUTROS ABS: 3974 {cells}/uL (ref 1500–7800)
Neutrophils Relative %: 73.6 %
Platelets: 202 10*3/uL (ref 140–400)
RBC: 4.64 10*6/uL (ref 3.80–5.10)
RDW: 11.9 % (ref 11.0–15.0)
Total Lymphocyte: 18 %
WBC: 5.4 10*3/uL (ref 3.8–10.8)
WBCMIX: 340 {cells}/uL (ref 200–950)

## 2017-07-02 LAB — HEPATIC FUNCTION PANEL
AG Ratio: 2 (calc) (ref 1.0–2.5)
ALBUMIN MSPROF: 4.4 g/dL (ref 3.6–5.1)
ALT: 17 U/L (ref 6–29)
AST: 24 U/L (ref 10–35)
Alkaline phosphatase (APISO): 47 U/L (ref 33–130)
BILIRUBIN TOTAL: 0.6 mg/dL (ref 0.2–1.2)
Bilirubin, Direct: 0.1 mg/dL (ref 0.0–0.2)
GLOBULIN: 2.2 g/dL (ref 1.9–3.7)
Indirect Bilirubin: 0.5 mg/dL (calc) (ref 0.2–1.2)
Total Protein: 6.6 g/dL (ref 6.1–8.1)

## 2017-07-02 LAB — VITAMIN B12: Vitamin B-12: 869 pg/mL (ref 200–1100)

## 2017-07-02 LAB — VITAMIN D 25 HYDROXY (VIT D DEFICIENCY, FRACTURES): Vit D, 25-Hydroxy: 105 ng/mL — ABNORMAL HIGH (ref 30–100)

## 2017-07-02 LAB — MICROALBUMIN / CREATININE URINE RATIO
CREATININE, URINE: 125 mg/dL (ref 20–275)
MICROALB UR: 2.2 mg/dL
MICROALB/CREAT RATIO: 18 ug/mg{creat} (ref <30)

## 2017-07-02 LAB — HEMOGLOBIN A1C
HEMOGLOBIN A1C: 4.7 %{Hb} (ref <5.7)
MEAN PLASMA GLUCOSE: 88 (calc)
eAG (mmol/L): 4.9 (calc)

## 2017-07-02 LAB — MAGNESIUM: MAGNESIUM: 1.9 mg/dL (ref 1.5–2.5)

## 2017-07-02 LAB — INSULIN, RANDOM: Insulin: 9.5 u[IU]/mL (ref 2.0–19.6)

## 2017-07-02 LAB — TSH: TSH: 1.9 m[IU]/L (ref 0.40–4.50)

## 2017-07-03 ENCOUNTER — Other Ambulatory Visit: Payer: Self-pay | Admitting: Physician Assistant

## 2017-07-03 ENCOUNTER — Other Ambulatory Visit: Payer: Self-pay | Admitting: Internal Medicine

## 2017-07-03 DIAGNOSIS — F411 Generalized anxiety disorder: Secondary | ICD-10-CM

## 2017-07-03 MED ORDER — ALPRAZOLAM 0.5 MG PO TABS: ORAL_TABLET | ORAL | 2 refills | Status: DC

## 2017-07-03 NOTE — Telephone Encounter (Signed)
Already called

## 2017-07-03 NOTE — Telephone Encounter (Signed)
Please call Alpraz  

## 2017-10-02 ENCOUNTER — Other Ambulatory Visit: Payer: Self-pay | Admitting: Physician Assistant

## 2017-10-02 DIAGNOSIS — F411 Generalized anxiety disorder: Secondary | ICD-10-CM

## 2017-11-10 ENCOUNTER — Other Ambulatory Visit: Payer: Self-pay | Admitting: Internal Medicine

## 2017-11-23 ENCOUNTER — Other Ambulatory Visit: Payer: Self-pay | Admitting: Internal Medicine

## 2017-11-23 DIAGNOSIS — F411 Generalized anxiety disorder: Secondary | ICD-10-CM

## 2018-01-09 ENCOUNTER — Other Ambulatory Visit: Payer: Self-pay | Admitting: Physician Assistant

## 2018-01-09 DIAGNOSIS — F411 Generalized anxiety disorder: Secondary | ICD-10-CM

## 2018-01-12 ENCOUNTER — Encounter: Payer: Self-pay | Admitting: Physician Assistant

## 2018-01-12 ENCOUNTER — Ambulatory Visit: Payer: BLUE CROSS/BLUE SHIELD | Admitting: Physician Assistant

## 2018-01-12 VITALS — BP 138/74 | Temp 97.6°F | Resp 16 | Ht 66.5 in | Wt 139.0 lb

## 2018-01-12 DIAGNOSIS — R310 Gross hematuria: Secondary | ICD-10-CM | POA: Diagnosis not present

## 2018-01-12 DIAGNOSIS — R21 Rash and other nonspecific skin eruption: Secondary | ICD-10-CM | POA: Diagnosis not present

## 2018-01-12 MED ORDER — TRIAMCINOLONE ACETONIDE 0.1 % EX OINT: 1.0000 "application " | TOPICAL_OINTMENT | Freq: Two times a day (BID) | CUTANEOUS | 1 refills | Status: DC

## 2018-01-12 MED ORDER — CIPROFLOXACIN HCL 500 MG PO TABS: 500.0000 mg | ORAL_TABLET | Freq: Two times a day (BID) | ORAL | 0 refills | Status: AC

## 2018-01-12 NOTE — Patient Instructions (Addendum)
Hematuria, Adult Hematuria is blood in your urine. It can be caused by a bladder infection, kidney infection, prostate infection, kidney stone, or cancer of your urinary tract. Infections can usually be treated with medicine, and a kidney stone usually will pass through your urine. If neither of these is the cause of your hematuria, further workup to find out the reason may be needed. It is very important that you tell your health care provider about any blood you see in your urine, even if the blood stops without treatment or happens without causing pain. Blood in your urine that happens and then stops and then happens again can be a symptom of a very serious condition. Also, pain is not a symptom in the initial stages of many urinary cancers. Follow these instructions at home:  Drink lots of fluid, 3-4 quarts a day. If you have been diagnosed with an infection, cranberry juice is especially recommended, in addition to large amounts of water.  Avoid caffeine, tea, and carbonated beverages because they tend to irritate the bladder.  Avoid alcohol because it may irritate the prostate.  Take all medicines as directed by your health care provider.  If you were prescribed an antibiotic medicine, finish it all even if you start to feel better.  If you have been diagnosed with a kidney stone, follow your health care provider's instructions regarding straining your urine to catch the stone.  Empty your bladder often. Avoid holding urine for long periods of time.  After a bowel movement, women should cleanse front to back. Use each tissue only once.  Empty your bladder before and after sexual intercourse if you are a female. Contact a health care provider if:  You develop back pain.  You have a fever.  You have a feeling of sickness in your stomach (nausea) or vomiting.  Your symptoms are not better in 3 days. Return sooner if you are getting worse. Get help right away if:  You develop  severe vomiting and are unable to keep the medicine down.  You develop severe back or abdominal pain despite taking your medicines.  You begin passing a large amount of blood or clots in your urine.  You feel extremely weak or faint, or you pass out. This information is not intended to replace advice given to you by your health care provider. Make sure you discuss any questions you have with your health care provider. Document Released: 09/23/2005 Document Revised: 02/29/2016 Document Reviewed: 05/24/2013 Elsevier Interactive Patient Education  2017 Elsevier Inc.   Rash A rash is a change in the color of the skin. A rash can also change the way your skin feels. There are many different conditions and factors that can cause a rash. Follow these instructions at home: Pay attention to any changes in your symptoms. Follow these instructions to help with your condition: Medicine Take or apply over-the-counter and prescription medicines only as told by your health care provider. These may include:  Corticosteroid cream.  Anti-itch lotions.  Oral antihistamines.  Skin Care  Apply cool compresses to the affected areas.  Try taking a bath with: ? Epsom salts. Follow the instructions on the packaging. You can get these at your local pharmacy or grocery store. ? Baking soda. Pour a small amount into the bath as told by your health care provider. ? Colloidal oatmeal. Follow the instructions on the packaging. You can get this at your local pharmacy or grocery store.  Try applying baking soda paste to your skin.  Stir water into baking soda until it reaches a paste-like consistency.  Do not scratch or rub your skin.  Avoid covering the rash. Make sure the rash is exposed to air as much as possible. General instructions  Avoid hot showers or baths, which can make itching worse. A cold shower may help.  Avoid scented soaps, detergents, and perfumes. Use gentle soaps, detergents, perfumes,  and other cosmetic products.  Avoid any substance that causes your rash. Keep a journal to help track what causes your rash. Write down: ? What you eat. ? What cosmetic products you use. ? What you drink. ? What you wear. This includes jewelry.  Keep all follow-up visits as told by your health care provider. This is important. Contact a health care provider if:  You sweat at night.  You lose weight.  You urinate more than normal.  You feel weak.  You vomit.  Your skin or the whites of your eyes look yellow (jaundice).  Your skin: ? Tingles. ? Is numb.  Your rash: ? Does not go away after several days. ? Gets worse.  You are: ? Unusually thirsty. ? More tired than normal.  You have: ? New symptoms. ? Pain in your abdomen. ? A fever. ? Diarrhea. Get help right away if:  You develop a rash that covers all or most of your body. The rash may or may not be painful.  You develop blisters that: ? Are on top of the rash. ? Grow larger or grow together. ? Are painful. ? Are inside your nose or mouth.  You develop a rash that: ? Looks like purple pinprick-sized spots all over your body. ? Has a "bull's eye" or looks like a target. ? Is not related to sun exposure, is red and painful, and causes your skin to peel. This information is not intended to replace advice given to you by your health care provider. Make sure you discuss any questions you have with your health care provider. Document Released: 09/13/2002 Document Revised: 02/27/2016 Document Reviewed: 02/08/2015 Elsevier Interactive Patient Education  2018 Elsevier Inc.   Lichen Planus Lichen planus is a skin problem. It causes redness, itching, small bumps, and sores. Areas of the body that are often affected include:  Arms, wrists, legs, or ankles.  Chest, back, or belly (abdomen).  Genital areas such as the vulva and vagina.  Gums and inside of the mouth.  Scalp.  Fingernails or  toenails.  Treatment can help to control symptoms. This condition is not passed from one person to another (not contagious). It can last for a long time. Follow these instructions at home:  Take over-the-counter and prescription medicines only as told by your doctor.  Use creams or ointments as told by your doctor.  Do not scratch the affected areas of skin.  Women should keep the vagina as clean and dry as they can. Contact a doctor if:  Your redness, swelling, or pain gets worse.  You have fluid, blood, or pus coming from the affected area.  Your eyes become irritated. This information is not intended to replace advice given to you by your health care provider. Make sure you discuss any questions you have with your health care provider. Document Released: 09/05/2008 Document Revised: 02/29/2016 Document Reviewed: 12/19/2014 Elsevier Interactive Patient Education  [employer].

## 2018-01-12 NOTE — Progress Notes (Signed)
Subjective:    Patient ID: Brooke Wu, female    DOB: 1961/01/01, 57 y.o.   MRN: [id number]  HPI 57 y.o. non smoking WF presents with two issues. She has rash off and on x 3 years, worse is under her bra strap right shoulder, itches. Will have rash under her axilla bilateral mostly on her right side. Uses cortisone cream that helps bumps under axilla but now the rash under her bra strap.  She woke up at 4 AM due to suprapubic discomfort, had pain with urination and had blood in her urine and on TP, some lower right back pain but states worse when she works out. Has frequency and urgency with it.  She has low grade temp per her since her temp "runs low", but no fever, chills otherwise. She is on BCP.  History of kidney stone when she was pregnant.   Blood pressure 138/74, temperature 97.6 F (36.4 C), resp. rate 16, height 5' 6.5" (1.689 m), weight 139 lb (63 kg).  Medications Current Outpatient Medications on File Prior to Visit  Medication Sig  . ALPRAZolam (XANAX) 0.5 MG tablet TAKE 1/2 TO 1 TABLET BY MOUTH TWO TO THREE TIMES DAILY ONLY IF NEEDED FOR SEVERE ANXIETY ATTACK. LIMIT TO FIVE DAYS PER WEEK  . bisoprolol-hydrochlorothiazide (ZIAC) 10-6.25 MG tablet TAKE 1/2- 1 TABLET BY MOUTH EVERY DAY FOR BLOOD PRESSURE  . norethindrone-ethinyl estradiol (JUNEL FE,GILDESS FE,LOESTRIN FE) 1-20 MG-MCG tablet Take 1 tablet by mouth daily.  . SUMAtriptan (IMITREX) 100 MG tablet TAKE 1 TABLET BY MOUTH 1 TIME AS NEEDED FOR MIGRAINE. MAY REPEAT IN 2 HOURS IF HEADACHE PERSISTS OR RECURS  . traZODone (DESYREL) 150 MG tablet Take 1/2to 1 tablet at hour of sleep as needed   No current facility-administered medications on file prior to visit.     Problem list She has HSV-2 (herpes simplex virus 2) infection; Vitamin B 12 deficiency; Anemia; Essential hypertension; Intrinsic asthma; Vitamin D deficiency; Elevated cholesterol; Abnormal glucose; Medication management; and Encounter for general adult  medical examination with abnormal findings on their problem list.   Review of Systems  Constitutional: Negative for chills.  HENT: Negative.   Respiratory: Negative.   Cardiovascular: Negative.   Gastrointestinal: Negative.  Negative for nausea and vomiting.  Genitourinary: Positive for dysuria, frequency, hematuria and urgency. Negative for decreased urine volume, difficulty urinating, dyspareunia, enuresis, flank pain, genital sores, menstrual problem, pelvic pain, vaginal bleeding and vaginal discharge.  Skin: Positive for rash.       Objective:   Physical Exam  Constitutional: She is oriented to person, place, and time. She appears well-developed and well-nourished.  Neck: Normal range of motion. Neck supple.  Cardiovascular: Normal rate and regular rhythm.  Pulmonary/Chest: Effort normal and breath sounds normal.  Abdominal: Soft. Bowel sounds are normal. She exhibits no distension and no mass. There is tenderness. There is no rebound and no guarding.  Musculoskeletal: Normal range of motion. She exhibits no tenderness.  Neurological: She is alert and oriented to person, place, and time.  Skin: Skin is warm and dry. Rash noted.  3-4 cm scaly thick plaque on right shoulder        Assessment & Plan:    Gross hematuria -     Urinalysis, Routine w reflex microscopic -     Urine Culture -     ciprofloxacin (CIPRO) 500 MG tablet; Take 1 tablet (500 mg total) by mouth 2 (two) times daily for 7 days. If not better or any  more bleeding follow up, may need CT versus vaginal [place]- no menses x 5 years, on BCP but symptoms very classic for UTI at this time  Rash Likely lichen planus/eczema- if not better may need BX versus referral.  -     triamcinolone ointment (KENALOG) 0.1 %; Apply 1 application topically 2 (two) times daily.

## 2018-01-14 ENCOUNTER — Encounter (INDEPENDENT_AMBULATORY_CARE_PROVIDER_SITE_OTHER): Payer: Self-pay

## 2018-01-14 LAB — URINE CULTURE
MICRO NUMBER:: 90430813
SPECIMEN QUALITY: ADEQUATE

## 2018-01-14 LAB — URINALYSIS, ROUTINE W REFLEX MICROSCOPIC
BILIRUBIN URINE: NEGATIVE
GLUCOSE, UA: NEGATIVE
Hyaline Cast: NONE SEEN /LPF
Nitrite: POSITIVE — AB
PH: 5.5 (ref 5.0–8.0)
Protein, ur: NEGATIVE
RBC / HPF: >=60 /HPF — AB (ref 0–2)
Specific Gravity, Urine: 1.023 (ref 1.001–1.03)

## 2018-01-14 NOTE — Addendum Note (Signed)
Addended by: Quentin MullingOLLIER, Heer Justiss R on: 01/14/2018 09:45 AM   Modules accepted: Orders

## 2018-01-19 ENCOUNTER — Other Ambulatory Visit: Payer: BLUE CROSS/BLUE SHIELD

## 2018-01-19 DIAGNOSIS — R310 Gross hematuria: Secondary | ICD-10-CM | POA: Diagnosis not present

## 2018-01-20 LAB — URINALYSIS, ROUTINE W REFLEX MICROSCOPIC
BILIRUBIN URINE: NEGATIVE
GLUCOSE, UA: NEGATIVE
Hgb urine dipstick: NEGATIVE
KETONES UR: NEGATIVE
Leukocytes, UA: NEGATIVE
Nitrite: NEGATIVE
PROTEIN: NEGATIVE
Specific Gravity, Urine: 1.021 (ref 1.001–1.03)
pH: 5.5 (ref 5.0–8.0)

## 2018-01-20 LAB — URINE CULTURE
MICRO NUMBER: 90461408
RESULT: NO GROWTH
SPECIMEN QUALITY: ADEQUATE

## 2018-02-12 ENCOUNTER — Other Ambulatory Visit: Payer: Self-pay | Admitting: Internal Medicine

## 2018-02-12 DIAGNOSIS — F411 Generalized anxiety disorder: Secondary | ICD-10-CM

## 2018-04-10 ENCOUNTER — Other Ambulatory Visit: Payer: Self-pay | Admitting: Internal Medicine

## 2018-04-10 DIAGNOSIS — F411 Generalized anxiety disorder: Secondary | ICD-10-CM

## 2018-05-08 ENCOUNTER — Other Ambulatory Visit: Payer: Self-pay | Admitting: Internal Medicine

## 2018-05-25 ENCOUNTER — Other Ambulatory Visit: Payer: Self-pay | Admitting: Adult Health

## 2018-05-25 DIAGNOSIS — F411 Generalized anxiety disorder: Secondary | ICD-10-CM

## 2018-06-30 DIAGNOSIS — Z6821 Body mass index (BMI) 21.0-21.9, adult: Secondary | ICD-10-CM | POA: Diagnosis not present

## 2018-06-30 DIAGNOSIS — Z1231 Encounter for screening mammogram for malignant neoplasm of breast: Secondary | ICD-10-CM | POA: Diagnosis not present

## 2018-06-30 DIAGNOSIS — Z1389 Encounter for screening for other disorder: Secondary | ICD-10-CM | POA: Diagnosis not present

## 2018-06-30 DIAGNOSIS — Z113 Encounter for screening for infections with a predominantly sexual mode of transmission: Secondary | ICD-10-CM | POA: Diagnosis not present

## 2018-06-30 DIAGNOSIS — Z01419 Encounter for gynecological examination (general) (routine) without abnormal findings: Secondary | ICD-10-CM | POA: Diagnosis not present

## 2018-06-30 DIAGNOSIS — Z130 Encounter for screening for diseases of the blood and blood-forming organs and certain disorders involving the immune mechanism: Secondary | ICD-10-CM | POA: Diagnosis not present

## 2018-07-06 ENCOUNTER — Other Ambulatory Visit: Payer: Self-pay | Admitting: Internal Medicine

## 2018-07-06 DIAGNOSIS — F411 Generalized anxiety disorder: Secondary | ICD-10-CM

## 2018-07-26 ENCOUNTER — Encounter: Payer: Self-pay | Admitting: Internal Medicine

## 2018-07-26 NOTE — Patient Instructions (Signed)
Preventive Care for Adults  A healthy lifestyle and preventive care can promote health and wellness. Preventive health guidelines for women include the following key practices.  A routine yearly physical is a good way to check with your health care provider about your health and preventive screening. It is a chance to share any concerns and updates on your health and to receive a thorough exam.  Visit your dentist for a routine exam and preventive care every 6 months. Brush your teeth twice a day and floss once a day. Good oral hygiene prevents tooth decay and gum disease.  The frequency of eye exams is based on your age, health, family medical history, use of contact lenses, and other factors. Follow your health care provider's recommendations for frequency of eye exams.  Eat a healthy diet. Foods like vegetables, fruits, whole grains, low-fat dairy products, and lean protein foods contain the nutrients you need without too many calories. Decrease your intake of foods high in solid fats, added sugars, and salt. Eat the right amount of calories for you. Get information about a proper diet from your health care provider, if necessary.  Regular physical exercise is one of the most important things you can do for your health. Most adults should get at least 150 minutes of moderate-intensity exercise (any activity that increases your heart rate and causes you to sweat) each week. In addition, most adults need muscle-strengthening exercises on 2 or more days a week.  Maintain a healthy weight. The body mass index (BMI) is a screening tool to identify possible weight problems. It provides an estimate of body fat based on height and weight. Your health care provider can find your BMI and can help you achieve or maintain a healthy weight. For adults 20 years and older:  A BMI below 18.5 is considered underweight.  A BMI of 18.5 to 24.9 is normal.  A BMI of 25 to 29.9 is considered overweight.  A BMI of  30 and above is considered obese.  Maintain normal blood lipids and cholesterol levels by exercising and minimizing your intake of saturated fat. Eat a balanced diet with plenty of fruit and vegetables. Blood tests for lipids and cholesterol should begin at age 53 and be repeated every 5 years. If your lipid or cholesterol levels are high, you are over 50, or you are at high risk for heart disease, you may need your cholesterol levels checked more frequently. Ongoing high lipid and cholesterol levels should be treated with medicines if diet and exercise are not working.  If you smoke, find out from your health care provider how to quit. If you do not use tobacco, do not start.  Lung cancer screening is recommended for adults aged 48-80 years who are at high risk for developing lung cancer because of a history of smoking. A yearly low-dose CT scan of the lungs is recommended for people who have at least a 30-pack-year history of smoking and are a current smoker or have quit within the past 15 years. A pack year of smoking is smoking an average of 1 pack of cigarettes a day for 1 year (for example: 1 pack a day for 30 years or 2 packs a day for 15 years). Yearly screening should continue until the smoker has stopped smoking for at least 15 years. Yearly screening should be stopped for people who develop a health problem that would prevent them from having lung cancer treatment.  High blood pressure causes heart disease and increases  the risk of stroke. Your blood pressure should be checked at least every 1 to 2 years. Ongoing high blood pressure should be treated with medicines if weight loss and exercise do not work.  If you are 63-27 years old, ask your health care provider if you should take aspirin to prevent strokes.  Diabetes screening involves taking a blood sample to check your fasting blood sugar level. This should be done once every 3 years, after age 54, if you are within normal weight and  without risk factors for diabetes. Testing should be considered at a younger age or be carried out more frequently if you are overweight and have at least 1 risk factor for diabetes.  Breast cancer screening is essential preventive care for women. You should practice "breast self-awareness." This means understanding the normal appearance and feel of your breasts and may include breast self-examination. Any changes detected, no matter how small, should be reported to a health care provider. Women in their 26s and 30s should have a clinical breast exam (CBE) by a health care provider as part of a regular health exam every 1 to 3 years. After age 84, women should have a CBE every year. Starting at age 56, women should consider having a mammogram (breast X-ray test) every year. Women who have a family history of breast cancer should talk to their health care provider about genetic screening. Women at a high risk of breast cancer should talk to their health care providers about having an MRI and a mammogram every year.  Breast cancer gene (BRCA)-related cancer risk assessment is recommended for women who have family members with BRCA-related cancers. BRCA-related cancers include breast, ovarian, tubal, and peritoneal cancers. Having family members with these cancers may be associated with an increased risk for harmful changes (mutations) in the breast cancer genes BRCA1 and BRCA2. Results of the assessment will determine the need for genetic counseling and BRCA1 and BRCA2 testing.  Routine pelvic exams to screen for cancer are no longer recommended for nonpregnant women who are considered low risk for cancer of the pelvic organs (ovaries, uterus, and vagina) and who do not have symptoms. Ask your health care provider if a screening pelvic exam is right for you.  If you have had past treatment for cervical cancer or a condition that could lead to cancer, you need Pap tests and screening for cancer for at least 20  years after your treatment. If Pap tests have been discontinued, your risk factors (such as having a new sexual partner) need to be reassessed to determine if screening should be resumed. Some women have medical problems that increase the chance of getting cervical cancer. In these cases, your health care provider may recommend more frequent screening and Pap tests.  Colorectal cancer can be detected and often prevented. Most routine colorectal cancer screening begins at the age of 63 years and continues through age 44 years. However, your health care provider may recommend screening at an earlier age if you have risk factors for colon cancer. On a yearly basis, your health care provider may provide home test kits to check for hidden blood in the stool. Use of a small camera at the end of a tube, to directly examine the colon (sigmoidoscopy or colonoscopy), can detect the earliest forms of colorectal cancer. Talk to your health care provider about this at age 68, when routine screening begins.  Direct exam of the colon should be repeated every 5-10 years through age 44 years, unless  early forms of pre-cancerous polyps or small growths are found.  Hepatitis C blood testing is recommended for all people born from [birth year] through 1965 and any individual with known risks for hepatitis C.  Pra  Osteoporosis is a disease in which the bones lose minerals and strength with aging. This can result in serious bone fractures or breaks. The risk of osteoporosis can be identified using a bone density scan. Women ages 56 years and over and women at risk for fractures or osteoporosis should discuss screening with their health care providers. Ask your health care provider whether you should take a calcium supplement or vitamin D to reduce the rate of osteoporosis.  Menopause can be associated with physical symptoms and risks. Hormone replacement therapy is available to decrease symptoms and risks. You should talk to your  health care provider about whether hormone replacement therapy is right for you.  Use sunscreen. Apply sunscreen liberally and repeatedly throughout the day. You should seek shade when your shadow is shorter than you. Protect yourself by wearing long sleeves, pants, a wide-brimmed hat, and sunglasses year round, whenever you are outdoors.  Once a month, do a whole body skin exam, using a mirror to look at the skin on your back. Tell your health care provider of new moles, moles that have irregular borders, moles that are larger than a pencil eraser, or moles that have changed in shape or color.  Stay current with required vaccines (immunizations).  Influenza vaccine. All adults should be immunized every year.  Tetanus, diphtheria, and acellular pertussis (Td, Tdap) vaccine. Pregnant women should receive 1 dose of Tdap vaccine during each pregnancy. The dose should be obtained regardless of the length of time since the last dose. Immunization is preferred during the 27th-36th week of gestation. An adult who has not previously received Tdap or who does not know her vaccine status should receive 1 dose of Tdap. This initial dose should be followed by tetanus and diphtheria toxoids (Td) booster doses every 10 years. Adults with an unknown or incomplete history of completing a 3-dose immunization series with Td-containing vaccines should begin or complete a primary immunization series including a Tdap dose. Adults should receive a Td booster every 10 years.  Varicella vaccine. An adult without evidence of immunity to varicella should receive 2 doses or a second dose if she has previously received 1 dose. Pregnant females who do not have evidence of immunity should receive the first dose after pregnancy. This first dose should be obtained before leaving the health care facility. The second dose should be obtained 4-8 weeks after the first dose.  Human papillomavirus (HPV) vaccine. Females aged 13-26 years  who have not received the vaccine previously should obtain the 3-dose series. The vaccine is not recommended for use in pregnant females. However, pregnancy testing is not needed before receiving a dose. If a female is found to be pregnant after receiving a dose, no treatment is needed. In that case, the remaining doses should be delayed until after the pregnancy. Immunization is recommended for any person with an immunocompromised condition through the age of 62 years if she did not get any or all doses earlier. During the 3-dose series, the second dose should be obtained 4-8 weeks after the first dose. The third dose should be obtained 24 weeks after the first dose and 16 weeks after the second dose.  Zoster vaccine. One dose is recommended for adults aged 46 years or older unless certain conditions are present.  Measles, mumps, and rubella (MMR) vaccine. Adults born before [birth year] generally are considered immune to measles and mumps. Adults born in [birth year] or later should have 1 or more doses of MMR vaccine unless there is a contraindication to the vaccine or there is laboratory evidence of immunity to each of the three diseases. A routine second dose of MMR vaccine should be obtained at least 28 days after the first dose for students attending postsecondary schools, health care workers, or international travelers. People who received inactivated measles vaccine or an unknown type of measles vaccine during 1963-1967 should receive 2 doses of MMR vaccine. People who received inactivated mumps vaccine or an unknown type of mumps vaccine before 1979 and are at high risk for mumps infection should consider immunization with 2 doses of MMR vaccine. For females of childbearing age, rubella immunity should be determined. If there is no evidence of immunity, females who are not pregnant should be vaccinated. If there is no evidence of immunity, females who are pregnant should delay immunization until after pregnancy.  Unvaccinated health care workers born before [birth year] who lack laboratory evidence of measles, mumps, or rubella immunity or laboratory confirmation of disease should consider measles and mumps immunization with 2 doses of MMR vaccine or rubella immunization with 1 dose of MMR vaccine.  Pneumococcal 13-valent conjugate (PCV13) vaccine. When indicated, a person who is uncertain of her immunization history and has no record of immunization should receive the PCV13 vaccine. An adult aged 74 years or older who has certain medical conditions and has not been previously immunized should receive 1 dose of PCV13 vaccine. This PCV13 should be followed with a dose of pneumococcal polysaccharide (PPSV23) vaccine. The PPSV23 vaccine dose should be obtained at least 1 or more year(s) after the dose of PCV13 vaccine. An adult aged 22 years or older who has certain medical conditions and previously received 1 or more doses of PPSV23 vaccine should receive 1 dose of PCV13. The PCV13 vaccine dose should be obtained 1 or more years after the last PPSV23 vaccine dose.    Pneumococcal polysaccharide (PPSV23) vaccine. When PCV13 is also indicated, PCV13 should be obtained first. All adults aged 91 years and older should be immunized. An adult younger than age 12 years who has certain medical conditions should be immunized. Any person who resides in a nursing home or long-term care facility should be immunized. An adult smoker should be immunized. People with an immunocompromised condition and certain other conditions should receive both PCV13 and PPSV23 vaccines. People with human immunodeficiency virus (HIV) infection should be immunized as soon as possible after diagnosis. Immunization during chemotherapy or radiation therapy should be avoided. Routine use of PPSV23 vaccine is not recommended for American Indians, [place] Natives, or people younger than 65 years unless there are medical conditions that require PPSV23 vaccine. When  indicated, people who have unknown immunization and have no record of immunization should receive PPSV23 vaccine. One-time revaccination 5 years after the first dose of PPSV23 is recommended for people aged 19-64 years who have chronic kidney failure, nephrotic syndrome, asplenia, or immunocompromised conditions. People who received 1-2 doses of PPSV23 before age 56 years should receive another dose of PPSV23 vaccine at age 28 years or later if at least 5 years have passed since the previous dose. Doses of PPSV23 are not needed for people immunized with PPSV23 at or after age 25 years.  Preventive Services / Frequency   Ages 4 to 68 years  Blood pressure check.  Lipid and cholesterol check.  Lung cancer screening. / Every year if you are aged 73-80 years and have a 30-pack-year history of smoking and currently smoke or have quit within the past 15 years. Yearly screening is stopped once you have quit smoking for at least 15 years or develop a health problem that would prevent you from having lung cancer treatment.  Clinical breast exam.** / Every year after age 68 years.   BRCA-related cancer risk assessment.** / For women who have family members with a BRCA-related cancer (breast, ovarian, tubal, or peritoneal cancers).  Mammogram.** / Every year beginning at age 26 years and continuing for as long as you are in good health. Consult with your health care provider.  Pap test.** / Every 3 years starting at age 59 years through age 18 or 58 years with a history of 3 consecutive normal Pap tests.  HPV screening.** / Every 3 years from ages 8 years through ages 14 to 66 years with a history of 3 consecutive normal Pap tests.  Fecal occult blood test (FOBT) of stool. / Every year beginning at age 2 years and continuing until age 59 years. You may not need to do this test if you get a colonoscopy every 10 years.  Flexible sigmoidoscopy or colonoscopy.** / Every 5 years for a flexible  sigmoidoscopy or every 10 years for a colonoscopy beginning at age 48 years and continuing until age 37 years.  Hepatitis C blood test.** / For all people born from [birth year] through 1967 and any individual with known risks for hepatitis C.  Skin self-exam. / Monthly.  Influenza vaccine. / Every year.  Tetanus, diphtheria, and acellular pertussis (Tdap/Td) vaccine.** / Consult your health care provider. Pregnant women should receive 1 dose of Tdap vaccine during each pregnancy. 1 dose of Td every 10 years.  Varicella vaccine.** / Consult your health care provider. Pregnant females who do not have evidence of immunity should receive the first dose after pregnancy.  Zoster vaccine.** / 1 dose for adults aged 69 years or older.  Pneumococcal 13-valent conjugate (PCV13) vaccine.** / Consult your health care provider.  Pneumococcal polysaccharide (PPSV23) vaccine.** / 1 to 2 doses if you smoke cigarettes or if you have certain conditions.  Meningococcal vaccine.** / Consult your health care provider.  Hepatitis A vaccine.** / Consult your health care provider.  Hepatitis B vaccine.** / Consult your health care provider. Screening for abdominal aortic aneurysm (AAA)  by ultrasound is recommended for people over 50 who have history of high blood pressure or who are current or former smokers. ++++++++++++++++++ Recommend Adult Low Dose Aspirin or  coated  Aspirin 81 mg daily  To reduce risk of Colon Cancer 20 %,  Skin Cancer 26 % ,  Melanoma 46%  and  Pancreatic cancer 60% +++++++++++++++++++ Vitamin D goal  is between 70-100.  Please make sure that you are taking your Vitamin D as directed.  It is very important as a natural anti-inflammatory  helping hair, skin, and nails, as well as reducing stroke and heart attack risk.  It helps your bones and helps with mood. It also decreases numerous cancer risks so please take it as directed.  Low Vit D is associated with a 200-300% higher risk  for CANCER  and 200-300% higher risk for HEART   ATTACK  &  STROKE.   .....................................Marland Kitchen It is also associated with higher death rate at younger ages,  autoimmune diseases like Rheumatoid arthritis, Lupus, Multiple Sclerosis.  Also many other serious conditions, like depression, Alzheimer's Dementia, infertility, muscle aches, fatigue, fibromyalgia - just to name a few. ++++++++++++++++++ Recommend the book "The END of DIETING" by Dr Joel Fuhrman  & the book "The END of DIABETES " by Dr Joel Fuhrman At [employer] - get book & Audio CD's    Being diabetic has a  300% increased risk for heart attack, stroke, cancer, and alzheimer- type vascular dementia. It is very important that you work harder with diet by avoiding all foods that are white. Avoid white rice (brown & wild rice is OK), white potatoes (sweetpotatoes in moderation is OK), White bread or wheat bread or anything made out of white flour like bagels, donuts, rolls, buns, biscuits, cakes, pastries, cookies, pizza crust, and pasta (made from white flour & egg whites) - vegetarian pasta or spinach or wheat pasta is OK. Multigrain breads like Arnold's or Pepper[employer], or multigrain sandwich thins or flatbreads.  Diet, exercise and weight loss can reverse and cure diabetes in the early stages.  Diet, exercise and weight loss is very important in the control and prevention of complications of diabetes which affects every system in your body, ie. Brain - dementia/stroke, eyes - glaucoma/blindness, heart - heart attack/heart failure, kidneys - dialysis, stomach - gastric paralysis, intestines - malabsorption, nerves - severe painful neuritis, circulation - gangrene & loss of a leg(s), and finally cancer and Alzheimers.    I recommend avoid fried & greasy foods,  sweets/candy, white rice (brown or wild rice or Quinoa is OK), white potatoes (sweet potatoes are OK) - anything made from white flour - bagels, doughnuts, rolls, buns,  biscuits,white and wheat breads, pizza crust and traditional pasta made of white flour & egg white(vegetarian pasta or spinach or wheat pasta is OK).  Multi-grain bread is OK - like multi-grain flat bread or sandwich thins. Avoid alcohol in excess. Exercise is also important.    Eat all the vegetables you want - avoid meat, especially red meat and dairy - especially cheese.  Cheese is the most concentrated form of trans-fats which is the worst thing to clog up our arteries. Veggie cheese is OK which can be found in the fresh produce section at [employer] or [employer] or [employer]  ++++++++++++++++++++++ DASH Eating Plan  DASH stands for "Dietary Approaches to Stop Hypertension."   The DASH eating plan is a healthy eating plan that has been shown to reduce high blood pressure (hypertension). Additional health benefits may include reducing the risk of type 2 diabetes mellitus, heart disease, and stroke. The DASH eating plan may also help with weight loss. WHAT DO I NEED TO KNOW ABOUT THE DASH EATING PLAN? For the DASH eating plan, you will follow these general guidelines:  Choose foods with a percent daily value for sodium of less than 5% (as listed on the food label).  Use salt-free seasonings or herbs instead of table salt or sea salt.  Check with your health care provider or pharmacist before using salt substitutes.  Eat lower-sodium products, often labeled as "lower sodium" or "no salt added."  Eat fresh foods.  Eat more vegetables, fruits, and low-fat dairy products.  Choose whole grains. Look for the word "whole" as the first word in the ingredient list.  Choose fish   Limit sweets, desserts, sugars, and sugary drinks.  Choose heart-healthy fats.  Eat veggie cheese   Eat more home-cooked food and less restaurant, buffet, and fast food.  Limit fried foods.  Cook foods using   methods other than frying.  Limit canned vegetables. If you do use them, rinse them well to  decrease the sodium.  When eating at a restaurant, ask that your food be prepared with less salt, or no salt if possible.                      WHAT FOODS CAN I EAT? Read Dr Fara Olden Fuhrman's books on The End of Dieting & The End of Diabetes  Grains Whole grain or whole wheat bread. Brown rice. Whole grain or whole wheat pasta. Quinoa, bulgur, and whole grain cereals. Low-sodium cereals. Corn or whole wheat flour tortillas. Whole grain cornbread. Whole grain crackers. Low-sodium crackers.  Vegetables Fresh or frozen vegetables (raw, steamed, roasted, or grilled). Low-sodium or reduced-sodium tomato and vegetable juices. Low-sodium or reduced-sodium tomato sauce and paste. Low-sodium or reduced-sodium canned vegetables.   Fruits All fresh, canned (in natural juice), or frozen fruits.  Protein Products  All fish and seafood.  Dried beans, peas, or lentils. Unsalted nuts and seeds. Unsalted canned beans.  Dairy Low-fat dairy products, such as skim or 1% milk, 2% or reduced-fat cheeses, low-fat ricotta or cottage cheese, or plain low-fat yogurt. Low-sodium or reduced-sodium cheeses.  Fats and Oils Tub margarines without trans fats. Light or reduced-fat mayonnaise and salad dressings (reduced sodium). Avocado. Safflower, olive, or canola oils. Natural peanut or almond butter.  Other Unsalted popcorn and pretzels. The items listed above may not be a complete list of recommended foods or beverages. Contact your dietitian for more options.  ++++++++++++++++++  WHAT FOODS ARE NOT RECOMMENDED? Grains/ White flour or wheat flour White bread. White pasta. White rice. Refined cornbread. Bagels and croissants. Crackers that contain trans fat.  Vegetables  Creamed or fried vegetables. Vegetables in a . Regular canned vegetables. Regular canned tomato sauce and paste. Regular tomato and vegetable juices.  Fruits Dried fruits. Canned fruit in light or heavy syrup. Fruit juice.  Meat and Other  Protein Products Meat in general - RED meat & White meat.  Fatty cuts of meat. Ribs, chicken wings, all processed meats as bacon, sausage, bologna, salami, fatback, hot dogs, bratwurst and packaged luncheon meats.  Dairy Whole or 2% milk, cream, half-and-half, and cream cheese. Whole-fat or sweetened yogurt. Full-fat cheeses or blue cheese. Non-dairy creamers and whipped toppings. Processed cheese, cheese spreads, or cheese curds.  Condiments Onion and garlic salt, seasoned salt, table salt, and sea salt. Canned and packaged gravies. Worcestershire sauce. Tartar sauce. Barbecue sauce. Teriyaki sauce. Soy sauce, including reduced sodium. Steak sauce. Fish sauce. Oyster sauce. Cocktail sauce. Horseradish. Ketchup and mustard. Meat flavorings and tenderizers. Bouillon cubes. Hot sauce. Tabasco sauce. Marinades. Taco seasonings. Relishes.  Fats and Oils Butter, stick margarine, lard, shortening and bacon fat. Coconut, palm kernel, or palm oils. Regular salad dressings.  Pickles and olives. Salted popcorn and pretzels.  The items listed above may not be a complete list of foods and beverages to avoid.

## 2018-07-26 NOTE — Progress Notes (Signed)
[facility] Lucky Cowboy, M.D.     Dyanne Carrel. Steffanie Dunn, P.A.-C Judd Gaudier, DNP [facility] [address] [place], [place]. [address] Telephone [phone] Telefax [phone] Annual Screening/Preventative Visit & Comprehensive Evaluation &  Examination     This very nice 57 y.o. MWF presents for a Screening /Preventative Visit & comprehensive evaluation and management of multiple medical co-morbidities.  Patient has been followed for HTN, HLD, Prediabetes  and Vitamin D Deficiency. She reports occasional Migraines which are described as bilat common w/o aura and usually promptly relieved with Imitrex tabs.      Labile HTN predates circa 2009. Patient's BP has been controlled at home, but patient has recently stopped her BP meds and patient denies any cardiac symptoms as chest pain, palpitations, shortness of breath, dizziness or ankle swelling. Today's BP was slightly elevated at 142/90.      Patient is statin intolerant and her lipids are not controlled with diet. Last lipids were not at goal:  Lab Results  Component Value Date   CHOL 192 07/01/2017   HDL 65 07/01/2017   LDLCALC 108 (H) 07/01/2017   TRIG 93 07/01/2017   CHOLHDL 3.0 07/01/2017      Patient is followed expectantly for  prediabetes and patient denies reactive hypoglycemic symptoms, visual blurring, diabetic polys or paresthesias. Last A1c was Normal & at goal: Lab Results  Component Value Date   HGBA1C 4.7 07/01/2017      Finally, patient has history of Vitamin D Deficiency and last Vitamin D was at goal: Lab Results  Component Value Date   VD25OH 105 (H) 07/01/2017   Current Outpatient Medications on File Prior to Visit  Medication Sig  . ALPRAZolam (XANAX) 0.5 MG tablet Take 1/2-1 tablet 2 - 3 x /day ONLY if needed for Anxiety Attack &  limit to 5 days /week to avoid addiction  . norethindrone-ethinyl estradiol (JUNEL FE,GILDESS FE,LOESTRIN  FE) 1-20 MG-MCG tablet Take 1 tablet by mouth daily.  . SUMAtriptan (IMITREX) 100 MG tablet TAKE 1 TABLET BY MOUTH 1 TIME AS NEEDED FOR MIGRAINE. MAY REPEAT IN 2 HOURS IF HEADACHE PERSISTS OR RECURS  . traZODone (DESYREL) 150 MG tablet Take 1/2to 1 tablet at hour of sleep as needed  . triamcinolone ointment (KENALOG) 0.1 % Apply 1 application topically 2 (two) times daily.   No current facility-administered medications on file prior to visit.    Allergies  Allergen Reactions  . Crestor [Rosuvastatin]   . Penicillins Rash  . Sulfa Antibiotics Rash   Past Medical History:  Diagnosis Date  . HSV-2 (herpes simplex virus 2) infection   . Hypertension   . Multiple body piercings    one in tongue-will take out for surgery-puts a plastic piece in  . Vitamin B 12 deficiency    Health Maintenance  Topic Date Due  . COLONOSCOPY  07/30/2009  . INFLUENZA VACCINE  05/07/2018  . PAP SMEAR  06/02/2018  . TETANUS/TDAP  02/08/2019  . MAMMOGRAM  07/01/2019  . Hepatitis C Screening  Completed  . HIV Screening  Completed   Immunization History  Administered Date(s) Administered  . PPD Test 04/14/2014, 04/18/2015, 05/07/2016, 07/01/2017  . Tdap 02/07/2009   Last Colon - is doing Cologard for Dr Huel Cote  Last MGM -  06/12/2016   Past Surgical History:  Procedure Laterality Date  . ORIF ANKLE FRACTURE  11/05/2012   Procedure: OPEN REDUCTION INTERNAL FIXATION (ORIF) ANKLE FRACTURE;  Surgeon: Toni Arthurs, MD;  Location: [facility];  Service: Orthopedics;  Laterality: Left;  open reduction internal fixation LEFT ANKLE TRIMALLEOLAR FRACTURE    Family History  Problem Relation Age of Onset  . COPD Mother   . Hypertension Father   . Diabetes Father    Social History   Tobacco Use  . Smoking status: Never Smoker  . Smokeless tobacco: Never Used  Substance Use Topics  . Alcohol use: Yes    Alcohol/week: 1.0 standard drinks    Types: 1 Standard drinks or equivalent  per week    Comment: occ  . Drug use: No    ROS Constitutional: Denies fever, chills, weight loss/gain, headaches, insomnia,  night sweats, and change in appetite. Does c/o fatigue. Eyes: Denies redness, blurred vision, diplopia, discharge, itchy, watery eyes.  ENT: Denies discharge, congestion, post nasal drip, epistaxis, sore throat, earache, hearing loss, dental pain, Tinnitus, Vertigo, Sinus pain, snoring.  Cardio: Denies chest pain, palpitations, irregular heartbeat, syncope, dyspnea, diaphoresis, orthopnea, PND, claudication, edema Respiratory: denies cough, dyspnea, DOE, pleurisy, hoarseness, laryngitis, wheezing.  Gastrointestinal: Denies dysphagia, heartburn, reflux, water brash, pain, cramps, nausea, vomiting, bloating, diarrhea, constipation, hematemesis, melena, hematochezia, jaundice, hemorrhoids Genitourinary: Denies dysuria, frequency, urgency, nocturia, hesitancy, discharge, hematuria, flank pain Breast: Breast lumps, nipple discharge, bleeding.  Musculoskeletal: Denies arthralgia, myalgia, stiffness, Jt. Swelling, pain, limp, and strain/sprain. Denies falls. Skin: Denies puritis, rash, hives, warts, acne, eczema, changing in skin lesion Neuro: No weakness, tremor, incoordination, spasms, paresthesia, pain Psychiatric: Denies confusion, memory loss, sensory loss. Denies Depression. Endocrine: Denies change in weight, skin, hair change, nocturia, and paresthesia, diabetic polys, visual blurring, hyper / hypo glycemic episodes.  Heme/Lymph: No excessive bleeding, bruising, enlarged lymph nodes.  Physical Exam  BP (!) 142/90   Pulse 80   Temp (!) 97.2 F (36.2 C)   Resp 16   Ht 5\' 7"  (1.702 m)   Wt 137 lb 3.2 oz (62.2 kg)   BMI 21.49 kg/m   General Appearance: Well nourished, well groomed and in no apparent distress.  Eyes: PERRLA, EOMs, conjunctiva no swelling or erythema, normal fundi and vessels. Sinuses: No frontal/maxillary tenderness ENT/Mouth: EACs patent / TMs   nl. Nares clear without erythema, swelling, mucoid exudates. Oral hygiene is good. No erythema, swelling, or exudate. Tongue normal, non-obstructing. Tonsils not swollen or erythematous. Hearing normal.  Neck: Supple, thyroid not palpable. No bruits, nodes or JVD. Respiratory: Respiratory effort normal.  BS equal and clear bilateral without rales, rhonci, wheezing or stridor. Cardio: Heart sounds are normal with regular rate and rhythm and no murmurs, rubs or gallops. Peripheral pulses are normal and equal bilaterally without edema. No aortic or femoral bruits. Chest: symmetric with normal excursions and percussion. Breasts: deferred to Dr Senaida Ores. Abdomen: Flat, soft with bowel sounds active. Nontender, no guarding, rebound, hernias, masses, or organomegaly.  Lymphatics: Non tender without lymphadenopathy.  Genitourinary: deferred to Dr Senaida Ores. Musculoskeletal: Full ROM all peripheral extremities, joint stability, 5/5 strength, and normal gait. Skin: Warm and dry without rashes, lesions, cyanosis, clubbing or  ecchymosis.  Neuro: Cranial nerves intact, reflexes equal bilaterally. Normal muscle tone, no cerebellar symptoms. Sensation intact.  Pysch: Alert and oriented X 3, normal affect, Insight and Judgment appropriate.   Assessment and Plan  1. Annual Preventative Screening Examination  2. Essential hypertension  - EKG 12-Lead - [place], RETROPERITNL ABD,  LTD - Urinalysis, Routine w reflex microscopic - Microalbumin / creatinine urine ratio - CBC with Differential/Platelet - COMPLETE METABOLIC PANEL WITH GFR - Magnesium - TSH  3. Hyperlipidemia, mixed  - EKG 12-Lead - [place], RETROPERITNL ABD,  LTD - Lipid panel - TSH  4. Abnormal glucose  - EKG 12-Lead - [place], RETROPERITNL ABD,  LTD - Hemoglobin A1c - Insulin, random  5. Vitamin D deficiency  - VITAMIN D 25 Hydroxyl  6. Screening for colorectal cancer  - POC Hemoccult Bld/Stl  7. Screening for ischemic heart  disease  - EKG 12-Lead - [place], RETROPERITNL ABD,  LTD  8. FH: hypertension  - EKG 12-Lead - [place], RETROPERITNL ABD,  LTD  9. Screening for AAA (aortic abdominal aneurysm)  - [place], RETROPERITNL ABD,  LTD  10. Screening examination for pulmonary tuberculosis  - TB Skin Test  11. Fatigue  - Iron,Total/Total Iron Binding Cap - Vitamin B12 - CBC with Differential/Platelet - TSH  12. Medication management  - [place], RETROPERITNL ABD,  LTD - Urinalysis, Routine w reflex microscopic - Microalbumin / creatinine urine ratio - CBC with Differential/Platelet - COMPLETE METABOLIC PANEL WITH GFR - Magnesium - Lipid panel - TSH - Hemoglobin A1c - Insulin, random - VITAMIN D 25 Hydroxyl        Patient was counseled in prudent diet to achieve/maintain BMI less than 25 for weight control, BP monitoring, regular exercise and medications. Discussed med's effects and SE's. Screening labs and tests as requested with regular follow-up as recommended. Over 40 minutes of exam, counseling, chart review and high complex critical decision making was performed.

## 2018-07-27 ENCOUNTER — Other Ambulatory Visit: Payer: Self-pay | Admitting: *Deleted

## 2018-07-27 ENCOUNTER — Ambulatory Visit: Payer: BLUE CROSS/BLUE SHIELD | Admitting: Internal Medicine

## 2018-07-27 ENCOUNTER — Encounter: Payer: Self-pay | Admitting: Internal Medicine

## 2018-07-27 VITALS — BP 142/90 | HR 80 | Temp 97.2°F | Resp 16 | Ht 67.0 in | Wt 137.2 lb

## 2018-07-27 DIAGNOSIS — Z0001 Encounter for general adult medical examination with abnormal findings: Secondary | ICD-10-CM

## 2018-07-27 DIAGNOSIS — I10 Essential (primary) hypertension: Secondary | ICD-10-CM | POA: Diagnosis not present

## 2018-07-27 DIAGNOSIS — Z13220 Encounter for screening for lipoid disorders: Secondary | ICD-10-CM | POA: Diagnosis not present

## 2018-07-27 DIAGNOSIS — Z0000 Encounter for general adult medical examination without abnormal findings: Secondary | ICD-10-CM

## 2018-07-27 DIAGNOSIS — Z131 Encounter for screening for diabetes mellitus: Secondary | ICD-10-CM | POA: Diagnosis not present

## 2018-07-27 DIAGNOSIS — Z8249 Family history of ischemic heart disease and other diseases of the circulatory system: Secondary | ICD-10-CM

## 2018-07-27 DIAGNOSIS — Z136 Encounter for screening for cardiovascular disorders: Secondary | ICD-10-CM

## 2018-07-27 DIAGNOSIS — Z23 Encounter for immunization: Secondary | ICD-10-CM | POA: Diagnosis not present

## 2018-07-27 DIAGNOSIS — Z130 Encounter for screening for diseases of the blood and blood-forming organs and certain disorders involving the immune mechanism: Secondary | ICD-10-CM | POA: Diagnosis not present

## 2018-07-27 DIAGNOSIS — R5383 Other fatigue: Secondary | ICD-10-CM

## 2018-07-27 DIAGNOSIS — Z1389 Encounter for screening for other disorder: Secondary | ICD-10-CM | POA: Diagnosis not present

## 2018-07-27 DIAGNOSIS — Z1212 Encounter for screening for malignant neoplasm of rectum: Secondary | ICD-10-CM

## 2018-07-27 DIAGNOSIS — Z79899 Other long term (current) drug therapy: Secondary | ICD-10-CM

## 2018-07-27 DIAGNOSIS — E782 Mixed hyperlipidemia: Secondary | ICD-10-CM

## 2018-07-27 DIAGNOSIS — R7309 Other abnormal glucose: Secondary | ICD-10-CM

## 2018-07-27 DIAGNOSIS — Z1211 Encounter for screening for malignant neoplasm of colon: Secondary | ICD-10-CM

## 2018-07-27 DIAGNOSIS — Z1329 Encounter for screening for other suspected endocrine disorder: Secondary | ICD-10-CM

## 2018-07-27 DIAGNOSIS — Z111 Encounter for screening for respiratory tuberculosis: Secondary | ICD-10-CM

## 2018-07-27 DIAGNOSIS — E559 Vitamin D deficiency, unspecified: Secondary | ICD-10-CM

## 2018-07-27 MED ORDER — BISOPROLOL-HYDROCHLOROTHIAZIDE 10-6.25 MG PO TABS: ORAL_TABLET | ORAL | 1 refills | Status: DC

## 2018-07-28 ENCOUNTER — Other Ambulatory Visit: Payer: Self-pay | Admitting: Internal Medicine

## 2018-07-28 DIAGNOSIS — E782 Mixed hyperlipidemia: Secondary | ICD-10-CM

## 2018-07-28 LAB — CBC WITH DIFFERENTIAL/PLATELET
BASOS PCT: 0.4 %
Basophils Absolute: 22 cells/uL (ref 0–200)
EOS ABS: 70 {cells}/uL (ref 15–500)
EOS PCT: 1.3 %
HCT: 46.1 % — ABNORMAL HIGH (ref 35.0–45.0)
HEMOGLOBIN: 16 g/dL — AB (ref 11.7–15.5)
Lymphs Abs: 1112 cells/uL (ref 850–3900)
MCH: 31.9 pg (ref 27.0–33.0)
MCHC: 34.7 g/dL (ref 32.0–36.0)
MCV: 92 fL (ref 80.0–100.0)
MONOS PCT: 6.3 %
MPV: 10.4 fL (ref 7.5–12.5)
NEUTROS ABS: 3856 {cells}/uL (ref 1500–7800)
Neutrophils Relative %: 71.4 %
Platelets: 227 10*3/uL (ref 140–400)
RBC: 5.01 10*6/uL (ref 3.80–5.10)
RDW: 12.4 % (ref 11.0–15.0)
Total Lymphocyte: 20.6 %
WBC mixed population: 340 cells/uL (ref 200–950)
WBC: 5.4 10*3/uL (ref 3.8–10.8)

## 2018-07-28 LAB — URINALYSIS, ROUTINE W REFLEX MICROSCOPIC
BILIRUBIN URINE: NEGATIVE
GLUCOSE, UA: NEGATIVE
HGB URINE DIPSTICK: NEGATIVE
Ketones, ur: NEGATIVE
LEUKOCYTES UA: NEGATIVE
Nitrite: NEGATIVE
PH: 6 (ref 5.0–8.0)
Protein, ur: NEGATIVE
Specific Gravity, Urine: 1.017 (ref 1.001–1.03)

## 2018-07-28 LAB — COMPLETE METABOLIC PANEL WITH GFR
AG Ratio: 1.8 (calc) (ref 1.0–2.5)
ALBUMIN MSPROF: 4.2 g/dL (ref 3.6–5.1)
ALT: 11 U/L (ref 6–29)
AST: 17 U/L (ref 10–35)
Alkaline phosphatase (APISO): 49 U/L (ref 33–130)
BUN: 13 mg/dL (ref 7–25)
CO2: 31 mmol/L (ref 20–32)
CREATININE: 0.87 mg/dL (ref 0.50–1.05)
Calcium: 9.9 mg/dL (ref 8.6–10.4)
Chloride: 103 mmol/L (ref 98–110)
GFR, EST AFRICAN AMERICAN: 85 mL/min/{1.73_m2} (ref >=60)
GFR, Est Non African American: 73 mL/min/{1.73_m2} (ref >=60)
GLUCOSE: 83 mg/dL (ref 65–99)
Globulin: 2.3 g/dL (calc) (ref 1.9–3.7)
Potassium: 4.2 mmol/L (ref 3.5–5.3)
Sodium: 142 mmol/L (ref 135–146)
TOTAL PROTEIN: 6.5 g/dL (ref 6.1–8.1)
Total Bilirubin: 0.6 mg/dL (ref 0.2–1.2)

## 2018-07-28 LAB — MAGNESIUM: Magnesium: 1.8 mg/dL (ref 1.5–2.5)

## 2018-07-28 LAB — VITAMIN B12: Vitamin B-12: >2000 pg/mL — ABNORMAL HIGH (ref 200–1100)

## 2018-07-28 LAB — IRON, TOTAL/TOTAL IRON BINDING CAP
%SAT: 38 % (ref 16–45)
Iron: 170 ug/dL — ABNORMAL HIGH (ref 45–160)
TIBC: 447 mcg/dL (calc) (ref 250–450)

## 2018-07-28 LAB — LIPID PANEL
Cholesterol: 217 mg/dL — ABNORMAL HIGH (ref <200)
HDL: 58 mg/dL (ref >50)
LDL Cholesterol (Calc): 133 mg/dL (calc) — ABNORMAL HIGH
Non-HDL Cholesterol (Calc): 159 mg/dL (calc) — ABNORMAL HIGH (ref <130)
TRIGLYCERIDES: 148 mg/dL (ref <150)
Total CHOL/HDL Ratio: 3.7 (calc) (ref <5.0)

## 2018-07-28 LAB — HEMOGLOBIN A1C
EAG (MMOL/L): 5.2 (calc)
Hgb A1c MFr Bld: 4.9 % of total Hgb (ref <5.7)
MEAN PLASMA GLUCOSE: 94 (calc)

## 2018-07-28 LAB — TSH: TSH: 1.49 m[IU]/L (ref 0.40–4.50)

## 2018-07-28 LAB — MICROALBUMIN / CREATININE URINE RATIO
Creatinine, Urine: 139 mg/dL (ref 20–275)
MICROALB UR: 1.9 mg/dL
Microalb Creat Ratio: 14 mcg/mg creat (ref <30)

## 2018-07-28 LAB — INSULIN, RANDOM: INSULIN: 6.5 u[IU]/mL (ref 2.0–19.6)

## 2018-07-28 LAB — VITAMIN D 25 HYDROXY (VIT D DEFICIENCY, FRACTURES): Vit D, 25-Hydroxy: 87 ng/mL (ref 30–100)

## 2018-07-28 MED ORDER — EZETIMIBE 10 MG PO TABS: ORAL_TABLET | ORAL | 1 refills | Status: DC

## 2018-07-29 ENCOUNTER — Other Ambulatory Visit: Payer: Self-pay | Admitting: Internal Medicine

## 2018-07-29 ENCOUNTER — Telehealth: Payer: Self-pay | Admitting: *Deleted

## 2018-07-29 DIAGNOSIS — I10 Essential (primary) hypertension: Secondary | ICD-10-CM

## 2018-07-29 MED ORDER — HYDROCHLOROTHIAZIDE 25 MG PO TABS: ORAL_TABLET | ORAL | 1 refills | Status: DC

## 2018-07-29 MED ORDER — ATENOLOL 100 MG PO TABS: ORAL_TABLET | ORAL | 1 refills | Status: DC

## 2018-07-29 NOTE — Telephone Encounter (Signed)
A message was left to inform the patient 2 new RX's have been sent in to replace the Ziac, which was too expensive. They are a BP tablet, Atenolol and a fluid pill, HCTZ The patient was informed of the changer.

## 2018-07-29 NOTE — Telephone Encounter (Signed)
The patient called and reported she is not going to start Zetia, for cholesterol.  She states she will change her diet and made a 3 month follow up  appointment. In regard to her B12 level being elevated, she states she took her B12 supplement the morning of her appointment.  Dr Oneta Rack is aware of all.

## 2018-08-25 ENCOUNTER — Other Ambulatory Visit: Payer: Self-pay | Admitting: Internal Medicine

## 2018-08-25 DIAGNOSIS — F411 Generalized anxiety disorder: Secondary | ICD-10-CM

## 2018-10-08 ENCOUNTER — Other Ambulatory Visit: Payer: Self-pay | Admitting: Internal Medicine

## 2018-10-08 DIAGNOSIS — F411 Generalized anxiety disorder: Secondary | ICD-10-CM

## 2018-10-09 ENCOUNTER — Other Ambulatory Visit: Payer: Self-pay | Admitting: Internal Medicine

## 2018-11-04 ENCOUNTER — Ambulatory Visit: Payer: Self-pay | Admitting: Internal Medicine

## 2018-11-25 ENCOUNTER — Other Ambulatory Visit: Payer: Self-pay | Admitting: Internal Medicine

## 2018-11-25 DIAGNOSIS — F411 Generalized anxiety disorder: Secondary | ICD-10-CM

## 2018-11-25 MED ORDER — ALPRAZOLAM 0.5 MG PO TABS: ORAL_TABLET | ORAL | 0 refills | Status: DC

## 2019-01-04 ENCOUNTER — Other Ambulatory Visit: Payer: Self-pay | Admitting: Internal Medicine

## 2019-01-04 DIAGNOSIS — F411 Generalized anxiety disorder: Secondary | ICD-10-CM

## 2019-01-25 DIAGNOSIS — F5105 Insomnia due to other mental disorder: Secondary | ICD-10-CM

## 2019-01-25 DIAGNOSIS — F419 Anxiety disorder, unspecified: Secondary | ICD-10-CM | POA: Insufficient documentation

## 2019-01-25 DIAGNOSIS — G4700 Insomnia, unspecified: Secondary | ICD-10-CM | POA: Insufficient documentation

## 2019-01-25 NOTE — Progress Notes (Signed)
Virtual Visit via Telephone Note  I connected with Brooke Wu on 01/26/19 at  1:00 PM EDT by a telephone enabled telemedicine application and verified that I am speaking with the correct person using two identifiers.   I discussed the limitations of evaluation and management by telemedicine and the availability of in person appointments. The patient expressed understanding and agreed to proceed.   I discussed the assessment and treatment plan with the patient. The patient was provided an opportunity to ask questions and all were answered. The patient agreed with the plan and demonstrated an understanding of the instructions.   The patient was advised to call back or seek an in-person evaluation if the symptoms worsen or if the condition fails to improve as anticipated.  I provided 30 minutes of non-face-to-face time during this encounter.   Dan Maker, NP    FOLLOW UP  Assessment and Plan:   Hypertension Historically well controlled in office, no recent dose changes Recommended she get a BP cuff Monitor blood pressure at home; patient to call if consistently greater than 130/80 Continue DASH diet.   Reminder to go to the ER if any CP, SOB, nausea, dizziness, severe HA, changes vision/speech, left arm numbness and tingling and jaw pain.  Cholesterol Last check above goal; she has since restarted strict diet that has worked well for her for years Declines all medications, never started zetia, hx of statin intolerance Continue low cholesterol diet and exercise.  Check lipid panel at next OV   Abnormal glucose Recent A1Cs at goal Discussed diet/exercise, weight management  Defer A1C to CPE  BMI 21 Continue to recommend diet heavy in fruits and veggies and low in animal meats, cheeses, and dairy products, appropriate calorie intake Discuss exercise recommendations routinely Continue to monitor weight at each visit  Vitamin D Def At goal at last visit; continue  supplementation to maintain goal of 70-100 Defer Vit D level   Anxiety/insomnia  Well managed by current regimen; continue medications, stable for several years Stress management techniques discussed, increase water, good sleep hygiene discussed, increase exercise, and increase veggies.    Continue diet and meds as discussed. Further disposition pending results of labs. Discussed med's effects and SE's.   Over 30 minutes of exam, counseling, chart review, and critical decision making was performed.   Future Appointments  Date Time Provider Department Center  08/10/2019  9:00 AM Lucky Cowboy, MD [id number] None    ----------------------------------------------------------------------------------------------------------------------  HPI 58 y.o. female  presents for 6 month follow up on hypertension, cholesterol, glucose management, weight, anxiety and vitamin D deficiency.   she has a diagnosis of anxiety/insomnia and is currently on xanax 0.5-1 mg, trazodone 75 mg, reports symptoms are well controlled on current regimen. she takes mainly at night at sleep.   BMI is There is no height or weight on file to calculate BMI., she has been working on diet and exercise. Wt Readings from Last 3 Encounters:  07/27/18 137 lb 3.2 oz (62.2 kg)  01/12/18 139 lb (63 kg)  07/01/17 135 lb 3.2 oz (61.3 kg)    She does not have BP cuff to check at home, typically well controlled in office without medication change in several years, today their BP is    She does workout. She denies chest pain, shortness of breath, dizziness.    She is not on cholesterol medication, she is aggressively working on lifestyle, was prescribed zetia but never started, hx of statin intolerance, and denies myalgias. Her  cholesterol is not at goal. The cholesterol last visit was:   Lab Results  Component Value Date   CHOL 217 (H) 07/27/2018   HDL 58 07/27/2018   LDLCALC 133 (H) 07/27/2018   TRIG 148 07/27/2018   CHOLHDL  3.7 07/27/2018    She has been working on diet and exercise for glucose management, and denies increased appetite, nausea, paresthesia of the feet, polydipsia, polyuria and visual disturbances. Last A1C in the office was:  Lab Results  Component Value Date   HGBA1C 4.9 07/27/2018   Patient is on Vitamin D supplement.   Lab Results  Component Value Date   VD25OH 87 07/27/2018        Current Medications:  Current Outpatient Medications on File Prior to Visit  Medication Sig  . ALPRAZolam (XANAX) 0.5 MG tablet Take 1/2-1 tablet 2 - 3 x /day ONLY if needed for Anxiety Attack &  limit to 5 days /week to avoid addiction  . atenolol (TENORMIN) 100 MG tablet Take 1 tablet every morning for BP  . Estradiol-Norethindrone Acet (MIMVEY PO) Take 0.5 mg by mouth daily.  . hydrochlorothiazide (HYDRODIURIL) 25 MG tablet Take 1 tablet daily for BP and fluid  . SUMAtriptan (IMITREX) 100 MG tablet TAKE 1 TABLET BY MOUTH 1 TIME AS NEEDED FOR MIGRAINE. MAY REPEAT IN 2 HOURS IF HEADACHE PERSISTS OR RECURS  . traZODone (DESYREL) 150 MG tablet TAKE 1/2 TO 1 TABLET BY MOUTH AT HOUR OF SLEEP AS NEEDED  . triamcinolone ointment (KENALOG) 0.1 % Apply 1 application topically 2 (two) times daily. (Patient taking differently: Apply 1 application topically as needed. )   No current facility-administered medications on file prior to visit.      Allergies:  Allergies  Allergen Reactions  . Crestor [Rosuvastatin]   . Penicillins Rash  . Sulfa Antibiotics Rash     Medical History:  Past Medical History:  Diagnosis Date  . HSV-2 (herpes simplex virus 2) infection   . Hypertension   . Multiple body piercings    one in tongue-will take out for surgery-puts a plastic piece in  . Vitamin B 12 deficiency    Family history- Reviewed and unchanged Social history- Reviewed and unchanged   Review of Systems:  Review of Systems  Constitutional: Negative for malaise/fatigue and weight loss.  HENT: Negative for  hearing loss and tinnitus.   Eyes: Negative for blurred vision and double vision.  Respiratory: Negative for cough, shortness of breath and wheezing.   Cardiovascular: Negative for chest pain, palpitations, orthopnea, claudication and leg swelling.  Gastrointestinal: Negative for abdominal pain, blood in stool, constipation, diarrhea, heartburn, melena, nausea and vomiting.  Genitourinary: Negative.   Musculoskeletal: Negative for joint pain and myalgias.  Skin: Negative for rash.  Neurological: Negative for dizziness, tingling, sensory change, weakness and headaches.  Endo/Heme/Allergies: Negative for polydipsia.  Psychiatric/Behavioral: The patient has insomnia. The patient is not nervous/anxious.   All other systems reviewed and are negative.     Physical Exam: There were no vitals taken for this visit. Wt Readings from Last 3 Encounters:  07/27/18 137 lb 3.2 oz (62.2 kg)  01/12/18 139 lb (63 kg)  07/01/17 135 lb 3.2 oz (61.3 kg)   General : Well sounding patient in no apparent distress HEENT: no hoarseness, no cough for duration of visit Lungs: speaks in complete sentences, no audible wheezing, no apparent distress Neurological: alert, oriented x 3 Psychiatric: pleasant, judgement appropriate     Dan MakerAshley C Jamerion Cabello, NP 1:43 PM [facility]  

## 2019-01-26 ENCOUNTER — Encounter: Payer: Self-pay | Admitting: Adult Health

## 2019-01-26 ENCOUNTER — Ambulatory Visit: Payer: Self-pay | Admitting: Adult Health

## 2019-01-26 ENCOUNTER — Other Ambulatory Visit: Payer: Self-pay

## 2019-01-26 ENCOUNTER — Ambulatory Visit: Payer: BLUE CROSS/BLUE SHIELD | Admitting: Adult Health

## 2019-01-26 DIAGNOSIS — Z79899 Other long term (current) drug therapy: Secondary | ICD-10-CM | POA: Diagnosis not present

## 2019-01-26 DIAGNOSIS — F419 Anxiety disorder, unspecified: Secondary | ICD-10-CM

## 2019-01-26 DIAGNOSIS — F5105 Insomnia due to other mental disorder: Secondary | ICD-10-CM

## 2019-01-26 DIAGNOSIS — Z6821 Body mass index (BMI) 21.0-21.9, adult: Secondary | ICD-10-CM

## 2019-01-26 DIAGNOSIS — J45909 Unspecified asthma, uncomplicated: Secondary | ICD-10-CM | POA: Diagnosis not present

## 2019-01-26 DIAGNOSIS — I10 Essential (primary) hypertension: Secondary | ICD-10-CM | POA: Diagnosis not present

## 2019-01-26 DIAGNOSIS — E559 Vitamin D deficiency, unspecified: Secondary | ICD-10-CM | POA: Diagnosis not present

## 2019-01-26 DIAGNOSIS — R7309 Other abnormal glucose: Secondary | ICD-10-CM

## 2019-01-26 DIAGNOSIS — E7800 Pure hypercholesterolemia, unspecified: Secondary | ICD-10-CM

## 2019-02-23 ENCOUNTER — Other Ambulatory Visit: Payer: Self-pay | Admitting: Internal Medicine

## 2019-02-23 DIAGNOSIS — F411 Generalized anxiety disorder: Secondary | ICD-10-CM

## 2019-02-23 MED ORDER — ALPRAZOLAM 0.5 MG PO TABS: ORAL_TABLET | ORAL | 0 refills | Status: DC

## 2019-03-05 ENCOUNTER — Other Ambulatory Visit: Payer: Self-pay | Admitting: Internal Medicine

## 2019-03-05 DIAGNOSIS — I10 Essential (primary) hypertension: Secondary | ICD-10-CM

## 2019-04-10 ENCOUNTER — Other Ambulatory Visit: Payer: Self-pay | Admitting: Internal Medicine

## 2019-04-10 DIAGNOSIS — F411 Generalized anxiety disorder: Secondary | ICD-10-CM

## 2019-04-14 ENCOUNTER — Other Ambulatory Visit: Payer: Self-pay | Admitting: Internal Medicine

## 2019-05-25 ENCOUNTER — Other Ambulatory Visit: Payer: Self-pay | Admitting: Internal Medicine

## 2019-05-25 DIAGNOSIS — F411 Generalized anxiety disorder: Secondary | ICD-10-CM

## 2019-05-25 MED ORDER — ALPRAZOLAM 0.5 MG PO TABS: ORAL_TABLET | ORAL | 0 refills | Status: DC

## 2019-06-07 ENCOUNTER — Other Ambulatory Visit: Payer: Self-pay | Admitting: Internal Medicine

## 2019-06-15 ENCOUNTER — Other Ambulatory Visit: Payer: Self-pay | Admitting: Internal Medicine

## 2019-06-15 MED ORDER — TRAZODONE HCL 150 MG PO TABS: ORAL_TABLET | ORAL | 3 refills | Status: DC

## 2019-07-09 ENCOUNTER — Other Ambulatory Visit: Payer: Self-pay | Admitting: Internal Medicine

## 2019-07-09 DIAGNOSIS — F411 Generalized anxiety disorder: Secondary | ICD-10-CM

## 2019-07-09 MED ORDER — ALPRAZOLAM 0.5 MG PO TABS: ORAL_TABLET | ORAL | 0 refills | Status: DC

## 2019-08-09 ENCOUNTER — Encounter: Payer: Self-pay | Admitting: Internal Medicine

## 2019-08-09 NOTE — Patient Instructions (Signed)

## 2019-08-09 NOTE — Progress Notes (Signed)
Annual Screening/Preventative Visit & Comprehensive Evaluation &  Examination     This very nice 58 y.o. MWF  presents for a Screening /Preventative Visit & comprehensive evaluation and management of multiple medical co-morbidities.  Patient has been followed for HTN, HLD, Prediabetes  and Vitamin D Deficiency. Patient also has hx/o Common Migraine w/o aura for which she take Sumatriptan with good results.       HTN predates since 2009. Patient's BP has been controlled at home and patient denies any cardiac symptoms as chest pain, palpitations, shortness of breath, dizziness or ankle swelling. Today's BP is at goal - 116/70.      Patient is intolerant to Statins and her hyperlipidemia is not controlled with diet and medications. Patient is reticient to take meds for Cholesterol. Last lipids were not at goal: Lab Results  Component Value Date   CHOL 217 (H) 07/27/2018   HDL 58 07/27/2018   LDLCALC 133 (H) 07/27/2018   TRIG 148 07/27/2018   CHOLHDL 3.7 07/27/2018      Patient is followed expectantly for glucose intolerance and patient denies reactive hypoglycemic symptoms, visual blurring, diabetic polys or paresthesias. Last A1c was Normal & at goal: Lab Results  Component Value Date   HGBA1C 4.9 07/27/2018      Finally, patient has history of Vitamin D Deficiency and last Vitamin D was at goal: Lab Results  Component Value Date   VD25OH 87 07/27/2018   Current Outpatient Medications on File Prior to Visit  Medication Sig  . ALPRAZolam (XANAX) 0.5 MG tablet Take 1/2-1 tablet 2 - 3 x /day ONLY if needed for Anxiety Attack &  limit to 5 days /week to avoid addiction  . atenolol (TENORMIN) 100 MG tablet TAKE 1 TABLET BY MOUTH EVERY MORNING FOR BLOOD PRESSURE  . Estradiol-Norethindrone Acet (MIMVEY PO) Take 0.5 mg by mouth daily.  . hydrochlorothiazide (HYDRODIURIL) 25 MG tablet TAKE 1 TABLET BY MOUTH DAILY FOR FLUID AND BLOOD PRESSURE  . SUMAtriptan (IMITREX) 100 MG tablet Take 1 tablet  immediately for Migraine & may repeat 1 x/ in 2 hours   - Maximum 2 tablets / 24 hours  . traZODone (DESYREL) 150 MG tablet Take 1 tablet 1 hour before Bedtime for Sleep  . triamcinolone ointment (KENALOG) 0.1 % Apply 1 application topically 2 (two) times daily. (Patient taking differently: Apply 1 application topically as needed. )   No current facility-administered medications on file prior to visit.    Allergies  Allergen Reactions  . Crestor [Rosuvastatin]   . Penicillins Rash  . Sulfa Antibiotics Rash   Past Medical History:  Diagnosis Date  . HSV-2 (herpes simplex virus 2) infection   . Hypertension   . Multiple body piercings    one in tongue-will take out for surgery-puts a plastic piece in  . Vitamin B 12 deficiency    Health Maintenance  Topic Date Due  . COLONOSCOPY  07/30/2009  . PAP SMEAR-Modifier  06/02/2018  . TETANUS/TDAP  02/08/2019  . INFLUENZA VACCINE  05/08/2019  . MAMMOGRAM  07/01/2019  . Hepatitis C Screening  Completed  . HIV Screening  Completed   Immunization History  Administered Date(s) Administered  . Influenza Inj Mdck Quad With Preservative 07/27/2018  . PPD Test 04/14/2014, 04/18/2015, 05/07/2016, 07/01/2017, 07/27/2018  . Tdap 02/07/2009   Last Colon - Never -  in 2019 Dr Paula Compton recommended  Cologard  Last MGM - 06/12/2016 - MGM   Past Surgical History:  Procedure Laterality Date  .  ORIF ANKLE FRACTURE  11/05/2012   Procedure: OPEN REDUCTION INTERNAL FIXATION (ORIF) ANKLE FRACTURE;  Surgeon: Toni ArthursJohn Hewitt, MD;  Location: [facility];  Service: Orthopedics;  Laterality: Left;  open reduction internal fixation LEFT ANKLE TRIMALLEOLAR FRACTURE    Family History  Problem Relation Age of Onset  . COPD Mother   . Hypertension Father   . Diabetes Father    Social History   Tobacco Use  . Smoking status: Never Smoker  . Smokeless tobacco: Never Used  Substance Use Topics  . Alcohol use: Yes    Alcohol/week: 1.0  standard drinks    Types: 1 Standard drinks or equivalent per week    Comment: occ  . Drug use: No    ROS Constitutional: Denies fever, chills, weight loss/gain, headaches, insomnia,  night sweats, and change in appetite. Does c/o fatigue. Eyes: Denies redness, blurred vision, diplopia, discharge, itchy, watery eyes.  ENT: Denies discharge, congestion, post nasal drip, epistaxis, sore throat, earache, hearing loss, dental pain, Tinnitus, Vertigo, Sinus pain, snoring.  Cardio: Denies chest pain, palpitations, irregular heartbeat, syncope, dyspnea, diaphoresis, orthopnea, PND, claudication, edema Respiratory: denies cough, dyspnea, DOE, pleurisy, hoarseness, laryngitis, wheezing.  Gastrointestinal: Denies dysphagia, heartburn, reflux, water brash, pain, cramps, nausea, vomiting, bloating, diarrhea, constipation, hematemesis, melena, hematochezia, jaundice, hemorrhoids Genitourinary: Denies dysuria, frequency, urgency, nocturia, hesitancy, discharge, hematuria, flank pain Breast: Breast lumps, nipple discharge, bleeding.  Musculoskeletal: Denies arthralgia, myalgia, stiffness, Jt. Swelling, pain, limp, and strain/sprain. Denies falls. Skin: Denies puritis, rash, hives, warts, acne, eczema, changing in skin lesion Neuro: No weakness, tremor, incoordination, spasms, paresthesia, pain Psychiatric: Denies confusion, memory loss, sensory loss. Denies Depression. Endocrine: Denies change in weight, skin, hair change, nocturia, and paresthesia, diabetic polys, visual blurring, hyper / hypo glycemic episodes.  Heme/Lymph: No excessive bleeding, bruising, enlarged lymph nodes.  Physical Exam  BP 116/70   Pulse 60   Temp (!) 97.2 F (36.2 C)   Resp 16   Ht 5\' 7"  (1.702 m)   Wt 133 lb 6.4 oz (60.5 kg)   BMI 20.89 kg/m   General Appearance: Well nourished, well groomed and in no apparent distress.  Eyes: PERRLA, EOMs, conjunctiva no swelling or erythema, normal fundi and vessels. Sinuses: No  frontal/maxillary tenderness ENT/Mouth: EACs patent / TMs  nl. Nares clear without erythema, swelling, mucoid exudates. Oral hygiene is good. No erythema, swelling, or exudate. Tongue normal, non-obstructing. Tonsils not swollen or erythematous. Hearing normal.  Neck: Supple, thyroid not palpable. No bruits, nodes or JVD. Respiratory: Respiratory effort normal.  BS equal and clear bilateral without rales, rhonci, wheezing or stridor. Cardio: Heart sounds are normal with regular rate and rhythm and no murmurs, rubs or gallops. Peripheral pulses are normal and equal bilaterally without edema. No aortic or femoral bruits. Chest: symmetric with normal excursions and percussion. Breasts: Deferred to Dr Senaida Oresichardson  Abdomen: Flat, soft with bowel sounds active. Nontender, no guarding, rebound, hernias, masses, or organomegaly.  Lymphatics: Non tender without lymphadenopathy.  Musculoskeletal: Full ROM all peripheral extremities, joint stability, 5/5 strength, and normal gait. Skin: Warm and dry without rashes, lesions, cyanosis, clubbing or  ecchymosis.  Neuro: Cranial nerves intact, reflexes equal bilaterally. Normal muscle tone, no cerebellar symptoms. Sensation intact.  Pysch: Alert and oriented x 3, normal affect, Insight and Judgment appropriate.   Assessment and Plan  1. Annual Preventative Screening Examination  2. Essential hypertension  - EKG 12-Lead - [place], RETROPERITNL ABD,  LTD - Urinalysis, Routine w reflex microscopic - Microalbumin / creatinine urine  ratio - CBC with Differential/Platelet - COMPLETE METABOLIC PANEL WITH GFR - Magnesium - TSH  3. Hyperlipidemia, mixed  - EKG 12-Lead - [place], RETROPERITNL ABD,  LTD - Lipid panel - TSH  4. Abnormal glucose  - EKG 12-Lead - [place], RETROPERITNL ABD,  LTD - Hemoglobin A1c - Insulin, random  5. Vitamin D deficiency  - VITAMIN D 25 Hydroxyl  6. Screening examination for pulmonary tuberculosis  - TB Skin Test  7. Screening  for colorectal cancer  - POC Hemoccult Bld/Stl  8. Screening for ischemic heart disease  - EKG 12-Lead - [place], RETROPERITNL ABD,  LTD  9. FH: hypertension  - EKG 12-Lead - [place], RETROPERITNL ABD,  LTD  10. Screening for AAA (aortic abdominal aneurysm)  - [place], RETROPERITNL ABD,  LTD  11. Fatigue, unspecified type  - Iron,Total/Total Iron Binding Cap - Vitamin B12 - CBC with Differential/Platelet - TSH  12. Medication management  - Urinalysis, Routine w reflex microscopic - Microalbumin / creatinine urine ratio - CBC with Differential/Platelet - COMPLETE METABOLIC PANEL WITH GFR - Magnesium - Lipid panel - TSH - Hemoglobin A1c - Insulin, random - VITAMIN D 25 Hydroxyl         Patient was counseled in prudent diet to achieve/maintain BMI less than 25 for weight control, BP monitoring, regular exercise and medications. Discussed med's effects and SE's. Screening labs and tests as requested with regular follow-up as recommended. Over 40 minutes of exam, counseling, chart review and high complex critical decision making was performed.   Marinus Maw, MD

## 2019-08-10 ENCOUNTER — Other Ambulatory Visit: Payer: Self-pay

## 2019-08-10 ENCOUNTER — Encounter: Payer: Self-pay | Admitting: Internal Medicine

## 2019-08-10 ENCOUNTER — Ambulatory Visit (INDEPENDENT_AMBULATORY_CARE_PROVIDER_SITE_OTHER): Payer: BC Managed Care – PPO | Admitting: Internal Medicine

## 2019-08-10 VITALS — BP 116/70 | HR 60 | Temp 97.2°F | Resp 16 | Ht 67.0 in | Wt 133.4 lb

## 2019-08-10 DIAGNOSIS — Z111 Encounter for screening for respiratory tuberculosis: Secondary | ICD-10-CM

## 2019-08-10 DIAGNOSIS — Z79899 Other long term (current) drug therapy: Secondary | ICD-10-CM | POA: Diagnosis not present

## 2019-08-10 DIAGNOSIS — E559 Vitamin D deficiency, unspecified: Secondary | ICD-10-CM | POA: Diagnosis not present

## 2019-08-10 DIAGNOSIS — Z1389 Encounter for screening for other disorder: Secondary | ICD-10-CM | POA: Diagnosis not present

## 2019-08-10 DIAGNOSIS — Z131 Encounter for screening for diabetes mellitus: Secondary | ICD-10-CM

## 2019-08-10 DIAGNOSIS — Z23 Encounter for immunization: Secondary | ICD-10-CM

## 2019-08-10 DIAGNOSIS — I10 Essential (primary) hypertension: Secondary | ICD-10-CM | POA: Diagnosis not present

## 2019-08-10 DIAGNOSIS — Z13220 Encounter for screening for lipoid disorders: Secondary | ICD-10-CM

## 2019-08-10 DIAGNOSIS — Z130 Encounter for screening for diseases of the blood and blood-forming organs and certain disorders involving the immune mechanism: Secondary | ICD-10-CM | POA: Diagnosis not present

## 2019-08-10 DIAGNOSIS — R5383 Other fatigue: Secondary | ICD-10-CM

## 2019-08-10 DIAGNOSIS — Z1212 Encounter for screening for malignant neoplasm of rectum: Secondary | ICD-10-CM

## 2019-08-10 DIAGNOSIS — Z0001 Encounter for general adult medical examination with abnormal findings: Secondary | ICD-10-CM

## 2019-08-10 DIAGNOSIS — Z8249 Family history of ischemic heart disease and other diseases of the circulatory system: Secondary | ICD-10-CM

## 2019-08-10 DIAGNOSIS — Z0000 Encounter for general adult medical examination without abnormal findings: Secondary | ICD-10-CM

## 2019-08-10 DIAGNOSIS — Z1329 Encounter for screening for other suspected endocrine disorder: Secondary | ICD-10-CM | POA: Diagnosis not present

## 2019-08-10 DIAGNOSIS — Z136 Encounter for screening for cardiovascular disorders: Secondary | ICD-10-CM | POA: Diagnosis not present

## 2019-08-10 DIAGNOSIS — Z1211 Encounter for screening for malignant neoplasm of colon: Secondary | ICD-10-CM

## 2019-08-10 DIAGNOSIS — E782 Mixed hyperlipidemia: Secondary | ICD-10-CM

## 2019-08-10 DIAGNOSIS — R7309 Other abnormal glucose: Secondary | ICD-10-CM

## 2019-08-11 DIAGNOSIS — Z01419 Encounter for gynecological examination (general) (routine) without abnormal findings: Secondary | ICD-10-CM | POA: Diagnosis not present

## 2019-08-11 DIAGNOSIS — Z1231 Encounter for screening mammogram for malignant neoplasm of breast: Secondary | ICD-10-CM | POA: Diagnosis not present

## 2019-08-11 DIAGNOSIS — Z1389 Encounter for screening for other disorder: Secondary | ICD-10-CM | POA: Diagnosis not present

## 2019-08-11 DIAGNOSIS — Z113 Encounter for screening for infections with a predominantly sexual mode of transmission: Secondary | ICD-10-CM | POA: Diagnosis not present

## 2019-08-11 DIAGNOSIS — Z6820 Body mass index (BMI) 20.0-20.9, adult: Secondary | ICD-10-CM | POA: Diagnosis not present

## 2019-08-11 DIAGNOSIS — N959 Unspecified menopausal and perimenopausal disorder: Secondary | ICD-10-CM | POA: Diagnosis not present

## 2019-08-11 LAB — INSULIN, RANDOM: Insulin: 3.3 u[IU]/mL

## 2019-08-11 LAB — COMPLETE METABOLIC PANEL WITH GFR
AG Ratio: 2.4 (calc) (ref 1.0–2.5)
ALT: 21 U/L (ref 6–29)
AST: 24 U/L (ref 10–35)
Albumin: 4.7 g/dL (ref 3.6–5.1)
Alkaline phosphatase (APISO): 53 U/L (ref 37–153)
BUN: 13 mg/dL (ref 7–25)
CO2: 33 mmol/L — ABNORMAL HIGH (ref 20–32)
Calcium: 9.8 mg/dL (ref 8.6–10.4)
Chloride: 101 mmol/L (ref 98–110)
Creat: 0.78 mg/dL (ref 0.50–0.99)
GFR, Est African American: 96 mL/min/{1.73_m2} (ref >=60)
GFR, Est Non African American: 83 mL/min/{1.73_m2} (ref >=60)
Globulin: 2 g/dL (calc) (ref 1.9–3.7)
Glucose, Bld: 89 mg/dL (ref 65–99)
Potassium: 3.7 mmol/L (ref 3.5–5.3)
Sodium: 142 mmol/L (ref 135–146)
Total Bilirubin: 0.9 mg/dL (ref 0.2–1.2)
Total Protein: 6.7 g/dL (ref 6.1–8.1)

## 2019-08-11 LAB — URINALYSIS, ROUTINE W REFLEX MICROSCOPIC
Bilirubin Urine: NEGATIVE
Glucose, UA: NEGATIVE
Hgb urine dipstick: NEGATIVE
Ketones, ur: NEGATIVE
Nitrite: NEGATIVE
Protein, ur: NEGATIVE
Specific Gravity, Urine: 1.022 (ref 1.001–1.03)
pH: 5.5 (ref 5.0–8.0)

## 2019-08-11 LAB — CBC WITH DIFFERENTIAL/PLATELET
Absolute Monocytes: 371 cells/uL (ref 200–950)
Basophils Absolute: 9 cells/uL (ref 0–200)
Basophils Relative: 0.2 %
Eosinophils Absolute: 61 cells/uL (ref 15–500)
Eosinophils Relative: 1.3 %
HCT: 43.8 % (ref 35.0–45.0)
Hemoglobin: 14.9 g/dL (ref 11.7–15.5)
Lymphs Abs: 1166 cells/uL (ref 850–3900)
MCH: 32.2 pg (ref 27.0–33.0)
MCHC: 34 g/dL (ref 32.0–36.0)
MCV: 94.6 fL (ref 80.0–100.0)
MPV: 10.8 fL (ref 7.5–12.5)
Monocytes Relative: 7.9 %
Neutro Abs: 3093 cells/uL (ref 1500–7800)
Neutrophils Relative %: 65.8 %
Platelets: 190 10*3/uL (ref 140–400)
RBC: 4.63 10*6/uL (ref 3.80–5.10)
RDW: 12.2 % (ref 11.0–15.0)
Total Lymphocyte: 24.8 %
WBC: 4.7 10*3/uL (ref 3.8–10.8)

## 2019-08-11 LAB — IRON, TOTAL/TOTAL IRON BINDING CAP
%SAT: 38 % (calc) (ref 16–45)
Iron: 133 ug/dL (ref 45–160)
TIBC: 349 mcg/dL (calc) (ref 250–450)

## 2019-08-11 LAB — LIPID PANEL
Cholesterol: 220 mg/dL — ABNORMAL HIGH (ref <200)
HDL: 68 mg/dL (ref >=50)
LDL Cholesterol (Calc): 131 mg/dL (calc) — ABNORMAL HIGH
Non-HDL Cholesterol (Calc): 152 mg/dL (calc) — ABNORMAL HIGH (ref <130)
Total CHOL/HDL Ratio: 3.2 (calc) (ref <5.0)
Triglycerides: 105 mg/dL (ref <150)

## 2019-08-11 LAB — VITAMIN B12: Vitamin B-12: 547 pg/mL (ref 200–1100)

## 2019-08-11 LAB — HEMOGLOBIN A1C
Hgb A1c MFr Bld: 4.8 % of total Hgb (ref <5.7)
Mean Plasma Glucose: 91 (calc)
eAG (mmol/L): 5 (calc)

## 2019-08-11 LAB — MICROALBUMIN / CREATININE URINE RATIO
Creatinine, Urine: 284 mg/dL — ABNORMAL HIGH (ref 20–275)
Microalb Creat Ratio: 4 mcg/mg creat (ref <30)
Microalb, Ur: 1.1 mg/dL

## 2019-08-11 LAB — VITAMIN D 25 HYDROXY (VIT D DEFICIENCY, FRACTURES): Vit D, 25-Hydroxy: 50 ng/mL (ref 30–100)

## 2019-08-11 LAB — MAGNESIUM: Magnesium: 1.9 mg/dL (ref 1.5–2.5)

## 2019-08-11 LAB — TSH: TSH: 0.9 mIU/L (ref 0.40–4.50)

## 2019-08-20 ENCOUNTER — Other Ambulatory Visit: Payer: Self-pay | Admitting: Internal Medicine

## 2019-08-20 DIAGNOSIS — F411 Generalized anxiety disorder: Secondary | ICD-10-CM

## 2019-09-26 ENCOUNTER — Other Ambulatory Visit: Payer: Self-pay | Admitting: Physician Assistant

## 2019-09-26 DIAGNOSIS — I10 Essential (primary) hypertension: Secondary | ICD-10-CM

## 2019-11-13 ENCOUNTER — Other Ambulatory Visit: Payer: Self-pay | Admitting: Internal Medicine

## 2019-11-13 DIAGNOSIS — F411 Generalized anxiety disorder: Secondary | ICD-10-CM

## 2019-12-16 ENCOUNTER — Other Ambulatory Visit: Payer: Self-pay | Admitting: Internal Medicine

## 2019-12-16 DIAGNOSIS — G441 Vascular headache, not elsewhere classified: Secondary | ICD-10-CM

## 2019-12-16 MED ORDER — SUMATRIPTAN SUCCINATE 100 MG PO TABS: ORAL_TABLET | ORAL | 0 refills | Status: DC

## 2019-12-22 ENCOUNTER — Other Ambulatory Visit: Payer: Self-pay | Admitting: Internal Medicine

## 2019-12-22 DIAGNOSIS — I10 Essential (primary) hypertension: Secondary | ICD-10-CM

## 2019-12-22 DIAGNOSIS — G441 Vascular headache, not elsewhere classified: Secondary | ICD-10-CM

## 2019-12-22 MED ORDER — SUMATRIPTAN SUCCINATE 100 MG PO TABS: ORAL_TABLET | ORAL | 3 refills | Status: DC

## 2020-02-16 ENCOUNTER — Other Ambulatory Visit: Payer: Self-pay | Admitting: Adult Health

## 2020-02-16 DIAGNOSIS — F411 Generalized anxiety disorder: Secondary | ICD-10-CM

## 2020-02-16 MED ORDER — ALPRAZOLAM 0.5 MG PO TABS: ORAL_TABLET | ORAL | 0 refills | Status: DC

## 2020-05-14 ENCOUNTER — Other Ambulatory Visit: Payer: Self-pay | Admitting: Adult Health

## 2020-05-14 DIAGNOSIS — F411 Generalized anxiety disorder: Secondary | ICD-10-CM

## 2020-06-02 ENCOUNTER — Encounter: Payer: Self-pay | Admitting: Adult Health

## 2020-06-02 ENCOUNTER — Ambulatory Visit: Payer: BC Managed Care – PPO | Admitting: Adult Health

## 2020-06-02 ENCOUNTER — Other Ambulatory Visit: Payer: Self-pay

## 2020-06-02 VITALS — BP 128/82 | Temp 97.7°F | Wt 140.0 lb

## 2020-06-02 DIAGNOSIS — H00012 Hordeolum externum right lower eyelid: Secondary | ICD-10-CM | POA: Diagnosis not present

## 2020-06-02 NOTE — Progress Notes (Signed)
Assessment and Plan:  Brooke Wu was seen today for eye problem.  Diagnoses and all orders for this visit:  Hordeolum externum of right lower eyelid Advised warm compresses 3-4 times a day, gently clean eyelids with baby shampoo on qtip or wash cloth and massage lid Avoid wearing eye makeup and switch to new once resolved Call with any changes or if not resolving in 2 weeks  Ophthalmology for any vision changes  Further disposition pending results of labs. Discussed med's effects and SE's.   Over 15 minutes of exam, counseling, chart review, and critical decision making was performed.   Future Appointments  Date Time Provider Department Center  09/12/2020  9:00 AM Lucky Cowboy, MD [facility] None    ------------------------------------------------------------------------------------------------------------------   HPI BP 128/82   Temp 97.7 F (36.5 C)   Wt 140 lb (63.5 kg)   BMI 21.93 kg/m   58 y.o.female presents for evaluation of tender lump to R lower eye lid.   She reports last night when taking off eye makeup noted tenderness and mild lump of R lower eye lid. Persistent this AM. Denies any discharge, vision changes, foreign body sensation. No other concerns.   No contacts, wears readers  Past Medical History:  Diagnosis Date  . HSV-2 (herpes simplex virus 2) infection   . Hypertension   . Multiple body piercings    one in tongue-will take out for surgery-puts a plastic piece in  . Vitamin B 12 deficiency      Allergies  Allergen Reactions  . Crestor [Rosuvastatin]   . Penicillins Rash  . Sulfa Antibiotics Rash    Current Outpatient Medications on File Prior to Visit  Medication Sig  . ALPRAZolam (XANAX) 0.5 MG tablet Take 1/2 - 1 tablet 2 - 3 x /day ONLY if needed for Anxiety Attack &  limit to 5 days /week to avoid Addiction & Dementia  . atenolol (TENORMIN) 100 MG tablet Take 1 tablet Daily for BP  . Estradiol-Norethindrone Acet (MIMVEY PO) Take 0.5 mg by  mouth daily.  . hydrochlorothiazide (HYDRODIURIL) 25 MG tablet Take 1 tablet Daily for BP & Fluid Retention / Ankle Swelling  . SUMAtriptan (IMITREX) 100 MG tablet Take 1 tablet immediately for Migraine & may repeat 1 x  / in 2 hours   - Maximum 2 tablets / 24 hours  . traZODone (DESYREL) 150 MG tablet Take 1 tablet 1 hour before Bedtime for Sleep  . triamcinolone ointment (KENALOG) 0.1 % Apply 1 application topically 2 (two) times daily. (Patient taking differently: Apply 1 application topically as needed. )   No current facility-administered medications on file prior to visit.    ROS: all negative except above.   Physical Exam:  BP 128/82   Temp 97.7 F (36.5 C)   Wt 140 lb (63.5 kg)   BMI 21.93 kg/m   General Appearance: Well nourished, well dressed, in no apparent distress. Eyes: PERRLA,  conjunctiva no swelling or erythema, no discharge. Eye lashes intact, no crusting or discharge. She has mild erythema and subtle lump to R lower inner eye lid with center comedone externally.  ENT/Mouth: Mask in place; Hearing normal.  Neck: Supple Respiratory: Respiratory effort normal Cardio: Appears well perfused Lymphatics: Non tender without lymphadenopathy.  Musculoskeletal: No obvious deformity, normal gait.  Skin: Warm, dry without rashes, lesions, ecchymosis Neuro: Normal muscle tone Psych: Awake and oriented X 3, normal affect, Insight and Judgment appropriate.     Dan Maker, NP 11:33 AM [facility]

## 2020-06-02 NOTE — Patient Instructions (Addendum)

## 2020-07-26 ENCOUNTER — Other Ambulatory Visit: Payer: Self-pay | Admitting: Internal Medicine

## 2020-07-26 ENCOUNTER — Other Ambulatory Visit: Payer: Self-pay

## 2020-07-26 DIAGNOSIS — F411 Generalized anxiety disorder: Secondary | ICD-10-CM

## 2020-07-26 MED ORDER — TRAZODONE HCL 150 MG PO TABS: ORAL_TABLET | ORAL | 3 refills | Status: DC

## 2020-07-26 MED ORDER — TRAZODONE HCL 150 MG PO TABS
ORAL_TABLET | ORAL | 0 refills | Status: DC
Start: 2020-07-26 — End: 2021-11-22

## 2020-09-11 ENCOUNTER — Encounter: Payer: Self-pay | Admitting: Internal Medicine

## 2020-09-11 NOTE — Patient Instructions (Signed)

## 2020-09-11 NOTE — Progress Notes (Signed)
Annual Screening/Preventative Visit & Comprehensive Evaluation &  Examination      This very nice 59 y.o. MWF  presents for a Screening /Preventative Visit & comprehensive evaluation and management of multiple medical co-morbidities.  Patient has been followed for HTN, HLD, Prediabetes  and Vitamin D Deficiency. Patient hx/o Common Migraine w/o aura for which she take Sumatriptan with good results.        HTN predates circa 2009. Patient' relates she takes her Atenolol & HCTZ about 1 x /week due to excessive urination and does not monitor her BP's.  Patient denies any cardiac symptoms as chest pain, palpitations, shortness of breath, dizziness or ankle swelling. Today's BP is at goal - 104/76.       Patient  has Statin Intolerance and her hyperlipidemia is not controlled with diet and medications. Patient is very resistant  to take meds for Cholesterol, but today relates that she may consider taking Zetia if Chol is very elevated. Last lipids were not at goal:  Lab Results  Component Value Date   CHOL 220 (H) 08/10/2019   HDL 68 08/10/2019   LDLCALC 131 (H) 08/10/2019   TRIG 105 08/10/2019   CHOLHDL 3.2 08/10/2019       Patient is followed expectantly for abnormal glucose or intolerance  and patient denies reactive hypoglycemic symptoms, visual blurring, diabetic polys or paresthesias. Last A1c was normal & at goal:  Lab Results  Component Value Date   HGBA1C 4.8 08/10/2019       Finally, patient has history of Vitamin D Deficiency and today she relates that she takes Vit D 2,000 units /day.  last Vitamin D was not at goal (70-100):  Lab Results  Component Value Date   VD25OH 50 08/10/2019    Current Outpatient Medications on File Prior to Visit  Medication Sig  . ALPRAZolam  0.5 MG tablet Take 1/2 - 1 tablet 2 - 3 x /day ONLY if needed   . atenolol  100 MG tablet Take 1 tablet Daily  . Estradiol-Norethindrone  (MIMVEY ) Take  daily.  . hydrochlorothiazide 25 MG tablet  Take 1 tablet Daily   . SUMAtriptan 100 MG tablet Take 1 tablet immediately for Migraine & may repeat 1 x  in 2 hours - Max 2 tablets /24 hours  . traZODone 150 MG tablet Take 1 tablet 1 hour before Bedtime for Sleep  . triamcinolone oint 0.1 % Apply 1 application topically 2 (two) times daily    Allergies  Allergen Reactions  . Crestor [Rosuvastatin]   . Penicillins Rash  . Sulfa Antibiotics Rash    Past Medical History:  Diagnosis Date  . HSV-2 (herpes simplex virus 2) infection   . Hypertension   . Multiple body piercings    one in tongue-will take out for surgery-puts a plastic piece in  . Vitamin B 12 deficiency    Health Maintenance  Topic Date Due  . COVID-19 Vaccine (1) Never done  . COLONOSCOPY  Never done  . PAP SMEAR-Modifier  06/02/2018  . MAMMOGRAM  07/01/2019  . INFLUENZA VACCINE  05/07/2020  . TETANUS/TDAP  08/09/2029  . Hepatitis C Screening  Completed  . HIV Screening  Completed   Immunization History  Administered Date(s) Administered  . Influenza Inj Mdck Quad With Preservative 07/27/2018, 08/10/2019  . PPD Test 04/14/2014, 04/18/2015, 05/07/2016, 07/01/2017, 07/27/2018, 08/10/2019  . Td 08/10/2019  . Tdap 02/07/2009   Last Colon - Never - Last year she did send in Cologard, but  specimen was rejected due to excess amount of stool sent in and she indicates unwillingness to repeat the Cologard .   Last MGM - tomorrow at Dr Olegario Messier Richardson's office  Past Surgical History:   Date  . ORIF LT ANKLE TRIMALLEOLAR Fx - Toni Arthurs, MD 11/05/2012   Family History  Problem Relation Age of Onset  . COPD Mother   . Hypertension Father   . Diabetes Father    Social History   Tobacco Use  . Smoking status: Never Smoker  . Smokeless tobacco: Never Used  Substance Use Topics  . Alcohol use: Yes    Alcohol/week: 1.0 standard drink    Types: 1 Standard drinks or equivalent per week    Comment: occ  . Drug use: No    ROS Constitutional: Denies fever,  chills, weight loss/gain, headaches, insomnia,  night sweats, and change in appetite. Does c/o fatigue. Eyes: Denies redness, blurred vision, diplopia, discharge, itchy, watery eyes.  ENT: Denies discharge, congestion, post nasal drip, epistaxis, sore throat, earache, hearing loss, dental pain, Tinnitus, Vertigo, Sinus pain, snoring.  Cardio: Denies chest pain, palpitations, irregular heartbeat, syncope, dyspnea, diaphoresis, orthopnea, PND, claudication, edema Respiratory: denies cough, dyspnea, DOE, pleurisy, hoarseness, laryngitis, wheezing.  Gastrointestinal: Denies dysphagia, heartburn, reflux, water brash, pain, cramps, nausea, vomiting, bloating, diarrhea, constipation, hematemesis, melena, hematochezia, jaundice, hemorrhoids Genitourinary: Denies dysuria, frequency, urgency, nocturia, hesitancy, discharge, hematuria, flank pain Breast: Breast lumps, nipple discharge, bleeding.  Musculoskeletal: Denies arthralgia, myalgia, stiffness, Jt. Swelling, pain, limp, and strain/sprain. Denies falls. Skin: Denies puritis, rash, hives, warts, acne, eczema, changing in skin lesion Neuro: No weakness, tremor, incoordination, spasms, paresthesia, pain Psychiatric: Denies confusion, memory loss, sensory loss. Denies Depression. Endocrine: Denies change in weight, skin, hair change, nocturia, and paresthesia, diabetic polys, visual blurring, hyper / hypo glycemic episodes.  Heme/Lymph: No excessive bleeding, bruising, enlarged lymph nodes.  Physical Exam  BP 104/76   Pulse 60   Temp (!) 97 F (36.1 C)   Resp 16   Ht 5\' 7"  (1.702 m)   Wt 135 lb 12.8 oz (61.6 kg)   BMI 21.27 kg/m   General Appearance: Well nourished, well groomed and in no apparent distress.  Eyes: PERRLA, EOMs, conjunctiva no swelling or erythema, normal fundi and vessels. Sinuses: No frontal/maxillary tenderness ENT/Mouth: EACs patent / TMs  nl. Hearing normal. Nares clear without erythema, swelling, mucoid exudates. Oral  hygiene is good. No erythema, swelling, or exudate. Tongue piercing , otherwise normal, non-obstructing. Tonsils not swollen or erythematous.  Neck: Supple, thyroid not palpable. No bruits, nodes or JVD. Respiratory: Respiratory effort normal.  BS equal and clear bilateral without rales, rhonci, wheezing or stridor. Cardio: Heart sounds are normal with regular rate and rhythm and no murmurs, rubs or gallops. Peripheral pulses are normal and equal bilaterally without edema. No aortic or femoral bruits. Chest: symmetric with normal excursions and percussion. Breasts: Deferred to Gyn Abdomen: Flat, soft with bowel sounds active. Nontender, no guarding, rebound, hernias, masses, or organomegaly.  Lymphatics: Non tender without lymphadenopathy.  Genitourinary:  Deferred to Gyn Musculoskeletal: Full ROM all peripheral extremities, joint stability, 5/5 strength, and normal gait. Skin: Warm and dry without rashes, lesions, cyanosis, clubbing or  ecchymosis. Multiple tattoos. Neuro: Cranial nerves intact, reflexes equal bilaterally. Normal muscle tone, no cerebellar symptoms. Sensation intact.  Pysch: Alert and oriented X 3, normal affect, Insight and Judgment appropriate.   Assessment and Plan  1. Annual Preventative Screening Examination   2. Essential hypertension  - EKG 12-Lead -  [place], RETROPERITNL ABD,  LTD - Urinalysis, Routine w reflex microscopic - Microalbumin / creatinine urine ratio - CBC with Differential/Platelet - COMPLETE METABOLIC PANEL WITH GFR - Magnesium - TSH  3. Hyperlipidemia, mixed  - EKG 12-Lead - [place], RETROPERITNL ABD,  LTD - Lipid panel - TSH  4. Abnormal glucose  - EKG 12-Lead - [place], RETROPERITNL ABD,  LTD - Hemoglobin A1c - Insulin, random  5. Vitamin D deficiency  - VITAMIN D 25 Hydroxyl  6. Vascular headache   7. Screening examination for pulmonary tuberculosis  - TB Skin Test  8. Screening for colorectal cancer  - POC Hemoccult Bld/Stl  9.  Screening for ischemic heart disease  - EKG 12-Lead  10. FH: hypertension  - EKG 12-Lead - [place], RETROPERITNL ABD,  LTD  11. Screening for AAA (aortic abdominal aneurysm)  - [place], RETROPERITNL ABD,  LTD  12. Fatigue, unspecified type  - Urinalysis, Routine w reflex microscopic - Microalbumin / creatinine urine ratio - Iron,Total/Total Iron Binding Cap - Vitamin B12 - CBC with Differential/Platelet - TSH  13. Medication management  - CBC with Differential/Platelet - COMPLETE METABOLIC PANEL WITH GFR - Magnesium - Lipid panel - TSH - Hemoglobin A1c - Insulin, random - VITAMIN D 25 Hydroxyl         Patient was counseled in prudent diet to maintain  weight control, BP monitoring, regular exercise and medications. Discussed med's effects and SE's. Screening labs and tests as requested with regular follow-up as recommended. Over 40 minutes of exam, counseling, chart review and high complex critical decision making was performed.   Marinus Maw, MD

## 2020-09-12 ENCOUNTER — Other Ambulatory Visit: Payer: Self-pay

## 2020-09-12 ENCOUNTER — Encounter: Payer: Self-pay | Admitting: Internal Medicine

## 2020-09-12 ENCOUNTER — Ambulatory Visit: Payer: BLUE CROSS/BLUE SHIELD | Admitting: Internal Medicine

## 2020-09-12 VITALS — BP 104/76 | HR 60 | Temp 97.0°F | Resp 16 | Ht 67.0 in | Wt 135.8 lb

## 2020-09-12 DIAGNOSIS — Z111 Encounter for screening for respiratory tuberculosis: Secondary | ICD-10-CM | POA: Diagnosis not present

## 2020-09-12 DIAGNOSIS — R5383 Other fatigue: Secondary | ICD-10-CM

## 2020-09-12 DIAGNOSIS — G441 Vascular headache, not elsewhere classified: Secondary | ICD-10-CM

## 2020-09-12 DIAGNOSIS — Z136 Encounter for screening for cardiovascular disorders: Secondary | ICD-10-CM | POA: Diagnosis not present

## 2020-09-12 DIAGNOSIS — I10 Essential (primary) hypertension: Secondary | ICD-10-CM | POA: Diagnosis not present

## 2020-09-12 DIAGNOSIS — Z130 Encounter for screening for diseases of the blood and blood-forming organs and certain disorders involving the immune mechanism: Secondary | ICD-10-CM | POA: Diagnosis not present

## 2020-09-12 DIAGNOSIS — Z1389 Encounter for screening for other disorder: Secondary | ICD-10-CM | POA: Diagnosis not present

## 2020-09-12 DIAGNOSIS — E559 Vitamin D deficiency, unspecified: Secondary | ICD-10-CM

## 2020-09-12 DIAGNOSIS — Z23 Encounter for immunization: Secondary | ICD-10-CM

## 2020-09-12 DIAGNOSIS — Z0000 Encounter for general adult medical examination without abnormal findings: Secondary | ICD-10-CM | POA: Diagnosis not present

## 2020-09-12 DIAGNOSIS — Z79899 Other long term (current) drug therapy: Secondary | ICD-10-CM | POA: Diagnosis not present

## 2020-09-12 DIAGNOSIS — Z131 Encounter for screening for diabetes mellitus: Secondary | ICD-10-CM

## 2020-09-12 DIAGNOSIS — Z13220 Encounter for screening for lipoid disorders: Secondary | ICD-10-CM | POA: Diagnosis not present

## 2020-09-12 DIAGNOSIS — Z1329 Encounter for screening for other suspected endocrine disorder: Secondary | ICD-10-CM

## 2020-09-12 DIAGNOSIS — Z1211 Encounter for screening for malignant neoplasm of colon: Secondary | ICD-10-CM

## 2020-09-12 DIAGNOSIS — E782 Mixed hyperlipidemia: Secondary | ICD-10-CM

## 2020-09-12 DIAGNOSIS — Z0001 Encounter for general adult medical examination with abnormal findings: Secondary | ICD-10-CM

## 2020-09-12 DIAGNOSIS — Z8249 Family history of ischemic heart disease and other diseases of the circulatory system: Secondary | ICD-10-CM | POA: Diagnosis not present

## 2020-09-12 DIAGNOSIS — R7309 Other abnormal glucose: Secondary | ICD-10-CM

## 2020-09-12 DIAGNOSIS — Z1212 Encounter for screening for malignant neoplasm of rectum: Secondary | ICD-10-CM

## 2020-09-12 MED ORDER — VITAMIN D3 50 MCG (2000 UT) PO TABS: ORAL_TABLET | ORAL | Status: DC

## 2020-09-13 LAB — COMPLETE METABOLIC PANEL WITH GFR
AG Ratio: 2.2 (calc) (ref 1.0–2.5)
ALT: 15 U/L (ref 6–29)
AST: 23 U/L (ref 10–35)
Albumin: 4.3 g/dL (ref 3.6–5.1)
Alkaline phosphatase (APISO): 62 U/L (ref 37–153)
BUN: 11 mg/dL (ref 7–25)
CO2: 30 mmol/L (ref 20–32)
Calcium: 9.4 mg/dL (ref 8.6–10.4)
Chloride: 105 mmol/L (ref 98–110)
Creat: 0.85 mg/dL (ref 0.50–0.99)
GFR, Est African American: 86 mL/min/{1.73_m2} (ref >=60)
GFR, Est Non African American: 74 mL/min/{1.73_m2} (ref >=60)
Globulin: 2 g/dL (calc) (ref 1.9–3.7)
Glucose, Bld: 72 mg/dL (ref 65–99)
Potassium: 4.2 mmol/L (ref 3.5–5.3)
Sodium: 143 mmol/L (ref 135–146)
Total Bilirubin: 0.7 mg/dL (ref 0.2–1.2)
Total Protein: 6.3 g/dL (ref 6.1–8.1)

## 2020-09-13 LAB — URINALYSIS, ROUTINE W REFLEX MICROSCOPIC
Bilirubin Urine: NEGATIVE
Glucose, UA: NEGATIVE
Hgb urine dipstick: NEGATIVE
Ketones, ur: NEGATIVE
Leukocytes,Ua: NEGATIVE
Nitrite: NEGATIVE
Protein, ur: NEGATIVE
Specific Gravity, Urine: 1.015 (ref 1.001–1.03)
pH: 6.5 (ref 5.0–8.0)

## 2020-09-13 LAB — CBC WITH DIFFERENTIAL/PLATELET
Absolute Monocytes: 333 cells/uL (ref 200–950)
Basophils Absolute: 10 cells/uL (ref 0–200)
Basophils Relative: 0.2 %
Eosinophils Absolute: 93 cells/uL (ref 15–500)
Eosinophils Relative: 1.9 %
HCT: 43.9 % (ref 35.0–45.0)
Hemoglobin: 14.9 g/dL (ref 11.7–15.5)
Lymphs Abs: 1039 cells/uL (ref 850–3900)
MCH: 31.6 pg (ref 27.0–33.0)
MCHC: 33.9 g/dL (ref 32.0–36.0)
MCV: 93 fL (ref 80.0–100.0)
MPV: 10.9 fL (ref 7.5–12.5)
Monocytes Relative: 6.8 %
Neutro Abs: 3425 cells/uL (ref 1500–7800)
Neutrophils Relative %: 69.9 %
Platelets: 215 10*3/uL (ref 140–400)
RBC: 4.72 10*6/uL (ref 3.80–5.10)
RDW: 12 % (ref 11.0–15.0)
Total Lymphocyte: 21.2 %
WBC: 4.9 10*3/uL (ref 3.8–10.8)

## 2020-09-13 LAB — HEMOGLOBIN A1C
Hgb A1c MFr Bld: 4.7 % of total Hgb (ref <5.7)
Mean Plasma Glucose: 88 mg/dL
eAG (mmol/L): 4.9 mmol/L

## 2020-09-13 LAB — VITAMIN D 25 HYDROXY (VIT D DEFICIENCY, FRACTURES): Vit D, 25-Hydroxy: 56 ng/mL (ref 30–100)

## 2020-09-13 LAB — IRON, TOTAL/TOTAL IRON BINDING CAP
%SAT: 34 % (calc) (ref 16–45)
Iron: 120 ug/dL (ref 45–160)
TIBC: 358 mcg/dL (calc) (ref 250–450)

## 2020-09-13 LAB — MICROALBUMIN / CREATININE URINE RATIO
Creatinine, Urine: 110 mg/dL (ref 20–275)
Microalb Creat Ratio: 3 mcg/mg creat (ref <30)
Microalb, Ur: 0.3 mg/dL

## 2020-09-13 LAB — VITAMIN B12: Vitamin B-12: 373 pg/mL (ref 200–1100)

## 2020-09-13 LAB — LIPID PANEL
Cholesterol: 213 mg/dL — ABNORMAL HIGH (ref <200)
HDL: 69 mg/dL (ref >=50)
LDL Cholesterol (Calc): 128 mg/dL (calc) — ABNORMAL HIGH
Non-HDL Cholesterol (Calc): 144 mg/dL (calc) — ABNORMAL HIGH (ref <130)
Total CHOL/HDL Ratio: 3.1 (calc) (ref <5.0)
Triglycerides: 67 mg/dL (ref <150)

## 2020-09-13 LAB — MAGNESIUM: Magnesium: 1.9 mg/dL (ref 1.5–2.5)

## 2020-09-13 LAB — INSULIN, RANDOM: Insulin: 5.1 u[IU]/mL

## 2020-09-13 LAB — TSH: TSH: 1.82 mIU/L (ref 0.40–4.50)

## 2020-09-13 NOTE — Progress Notes (Signed)
========================================================== ==========================================================  -    Total Chol = 213 - Elevated (Ideal or Goal is less than 180)  - and   - Bad ./dangerous LDL Chol = 128 - Also, very Elevated  (Ideal or Goal is less than 70  ! )   - Recommend a stricter  low cholesterol diet   - Cholesterol only comes from animal sources  - ie. meat, dairy, egg yolks  - Eat all the vegetables you want.  - Avoid meat, especially red meat - Beef AND Pork .  - Avoid cheese & dairy - milk & ice cream.     - Cheese is the most concentrated form of trans-fats which  is the worst thing to clog up our arteries.   - Veggie cheese is OK which can be found in the fresh  produce section at [employer] or [employer] or [employer] ==========================================================  -  A1c - Normal -great - No Diabetes ==========================================================  -  Vit D = 56 - Sl Low   - Vitamin D goal is between 70-100.   - It is very important as a natural anti-inflammatory and helping the  immune system protect against viral infections, like the Covid-19    helping hair, skin, and nails, as well as reducing stroke and  heart attack risk.   - It helps your bones and helps with mood.  - It also decreases numerous cancer risks so please  take it as directed.   - Low Vit D is associated with a 200-300% higher risk for  CANCER   and 200-300% higher risk for HEART   ATTACK  &  STROKE.    - It is also associated with higher death rate at younger ages,   autoimmune diseases like Rheumatoid arthritis, Lupus,  Multiple Sclerosis.     - Also many other serious conditions, like depression, Alzheimer's  Dementia, infertility, muscle aches, fatigue, fibromyalgia   - just to name a few. ==========================================================  - All Else - CBC - Kidneys - U/A -  Electrolytes - Liver - Magnesium &  Thyroid    - all  Normal / OK ====================================================

## 2020-09-13 NOTE — Progress Notes (Signed)
========================================================== -   Test results slightly outside the reference range are not unusual. If there is anything important, I will review this with you,  otherwise it is considered normal test values.  If you have further questions,  please do not hesitate to contact me at the office or via My Chart.  ==========================================================  -  Iron level - Normal  ==========================================================   -  Vitamin B12 =   373    -   Very Low  (Ideal or Goal Vit B12 is between 450 - 1,100)   Low Vit B12 may be associated with Anemia, Fatigue,   Peripheral Neuropathy, Dementia, "Brain Fog"  & Depression  - Recommend take a sub-lingual form of Vitamin B12 tablet   1,000 to 5,000 mcg tab that you dissolve under your tongue  /Daily   - Can get Lavonia Dana - best price at [employer] or on [employer]

## 2020-09-14 LAB — TB SKIN TEST
Induration: 0 mm
TB Skin Test: NEGATIVE

## 2020-09-19 DIAGNOSIS — Z124 Encounter for screening for malignant neoplasm of cervix: Secondary | ICD-10-CM | POA: Diagnosis not present

## 2020-09-19 DIAGNOSIS — Z1231 Encounter for screening mammogram for malignant neoplasm of breast: Secondary | ICD-10-CM | POA: Diagnosis not present

## 2020-09-19 DIAGNOSIS — Z01419 Encounter for gynecological examination (general) (routine) without abnormal findings: Secondary | ICD-10-CM | POA: Diagnosis not present

## 2020-09-19 DIAGNOSIS — Z1151 Encounter for screening for human papillomavirus (HPV): Secondary | ICD-10-CM | POA: Diagnosis not present

## 2020-09-19 DIAGNOSIS — Z6821 Body mass index (BMI) 21.0-21.9, adult: Secondary | ICD-10-CM | POA: Diagnosis not present

## 2020-09-19 LAB — HM PAP SMEAR: HM Pap smear: NEGATIVE

## 2020-09-21 ENCOUNTER — Other Ambulatory Visit: Payer: Self-pay | Admitting: Internal Medicine

## 2020-09-21 ENCOUNTER — Encounter: Payer: Self-pay | Admitting: Physician Assistant

## 2020-09-21 DIAGNOSIS — I10 Essential (primary) hypertension: Secondary | ICD-10-CM | POA: Insufficient documentation

## 2020-09-21 MED ORDER — AZITHROMYCIN 250 MG PO TABS: ORAL_TABLET | ORAL | 1 refills | Status: DC

## 2020-09-21 MED ORDER — DEXAMETHASONE 4 MG PO TABS: ORAL_TABLET | ORAL | 0 refills | Status: DC

## 2020-09-22 ENCOUNTER — Telehealth: Payer: Self-pay | Admitting: Physician Assistant

## 2020-09-22 NOTE — Telephone Encounter (Signed)
Called to discuss with Brooke Wu about Covid symptoms and the use of  monoclonal antibody infusion for those with mild to moderate Covid symptoms and at a high risk of hospitalization.     Pt is qualified for this infusion due to co-morbid conditions and/or a member of an at-risk group, however declines infusion at this time.  Symptoms reviewed as well as criteria for ending isolation.  Symptoms reviewed that would warrant ED/Hospital evaluation. Preventative practices reviewed. Patient verbalized understanding. Patient advised to call back if he decides that he does want to get infusion. Callback number to the infusion center given. Patient advised to go to Urgent care or ED with severe symptoms.    Patient Active Problem List   Diagnosis Date Noted  . Hypertension   . 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 Anxiety 01/25/2019  . Elevated cholesterol 04/18/2015  . Abnormal glucose 04/18/2015  . Medication management 04/18/2015  . Essential hypertension 04/14/2014  . Vitamin D deficiency 04/14/2014  . HSV-2 (herpes simplex virus 2) infection   . Vitamin B 12 deficiency   . Anemia    Manson Passey, PA-C

## 2020-10-17 ENCOUNTER — Encounter: Payer: Self-pay | Admitting: *Deleted

## 2020-10-25 ENCOUNTER — Other Ambulatory Visit: Payer: Self-pay | Admitting: Internal Medicine

## 2020-10-25 ENCOUNTER — Other Ambulatory Visit: Payer: Self-pay | Admitting: Adult Health Nurse Practitioner

## 2020-10-25 DIAGNOSIS — F411 Generalized anxiety disorder: Secondary | ICD-10-CM

## 2020-10-25 NOTE — Progress Notes (Signed)
Sent in refill for alprazolam, qty 45.  Sent message to front to schedule 56month OV.  No other visits until 12/22    [name], [name], DNP [facility] 10/25/2020  3:34 PM

## 2020-11-21 ENCOUNTER — Telehealth: Payer: Self-pay | Admitting: *Deleted

## 2020-11-21 NOTE — Telephone Encounter (Signed)
Patient called and reported fever and chills 2 days ago, but no elevation today. Patient had negative Covid test on 11/18/2020. States she has fatigue, joint pain and soreness in chest. Per Dr Oneta Rack, recheck for Covid today and try Tylenol and heat to chest for soreness. Patient will call back if needed.

## 2020-12-17 ENCOUNTER — Other Ambulatory Visit: Payer: Self-pay | Admitting: Adult Health Nurse Practitioner

## 2020-12-17 DIAGNOSIS — F411 Generalized anxiety disorder: Secondary | ICD-10-CM

## 2021-01-18 ENCOUNTER — Other Ambulatory Visit: Payer: Self-pay | Admitting: Adult Health

## 2021-01-18 DIAGNOSIS — F411 Generalized anxiety disorder: Secondary | ICD-10-CM

## 2021-01-30 ENCOUNTER — Other Ambulatory Visit: Payer: Self-pay | Admitting: Internal Medicine

## 2021-01-30 MED ORDER — DEXAMETHASONE 4 MG PO TABS: ORAL_TABLET | ORAL | 0 refills | Status: DC

## 2021-01-30 MED ORDER — AZITHROMYCIN 250 MG PO TABS: ORAL_TABLET | ORAL | 0 refills | Status: DC

## 2021-03-05 ENCOUNTER — Other Ambulatory Visit: Payer: Self-pay | Admitting: Adult Health

## 2021-03-05 DIAGNOSIS — F411 Generalized anxiety disorder: Secondary | ICD-10-CM

## 2021-03-28 ENCOUNTER — Ambulatory Visit: Payer: BC Managed Care – PPO | Admitting: Internal Medicine

## 2021-04-16 NOTE — Progress Notes (Signed)
FOLLOW UP  Assessment and Plan:   Hypertension Continue atenolol 100mg  QD Monitor blood pressure at home; patient to call if consistently greater than 130/80 Continue DASH diet.   Reminder to go to the ER if any CP, SOB, nausea, dizziness, severe HA, changes vision/speech, left arm numbness and tingling and jaw pain.  Hyperlipidemia Last check above goal; she has since restarted strict diet that has worked well for her for years Declines all medications, never started zetia, hx of statin intolerance Continue low cholesterol diet and exercise.  Check lipid panel at next OV   Chronic migraine without aura  Using Imitrex several days a week but does control migraines. Pt has not used a preventative medication  Abnormal glucose Recent A1Cs at goal Discussed diet/exercise, weight management  Defer A1C to CPE  BMI 22 Continue to recommend diet heavy in fruits and veggies and low in animal meats, cheeses, and dairy products, appropriate calorie intake Discuss exercise recommendations routinely Continue to monitor weight at each visit  Vitamin D Def       At goal at last visit; continue supplementation to maintain goal of 70-100       Defer Vit D level   Anxiety/Insomnia  Well managed by current regimen; continue medications, stable for several years       Stress management techniques discussed, increase water, good sleep hygiene discussed, increase exercise, and increase veggies.        Continues to take Alprazolam 0.5mg  1/2-1 tab 2 times a day limit to 5 days a week.  Continue Tazodone 150mg  1 hour before bedtime for insomnia.        Continue diet and meds as discussed. Further disposition pending results of labs. Discussed med's effects and SE's.   Over 30 minutes of exam, counseling, chart review, and critical decision making was performed.   Future Appointments  Date Time Provider Department Center  09/27/2021  2:00 PM , MD [facility] None     ----------------------------------------------------------------------------------------------------------------------  HPI 60 y.o. female  presents for 6 month follow up on hypertension, cholesterol, glucose management, weight, anxiety and vitamin D deficiency.   She has a diagnosis of anxiety/insomnia and is currently on xanax 0.5-1 mg, trazodone 75 mg, reports symptoms are well controlled on current regimen. She takes mainly at night for sleep.   BMI is Body mass index is 22.37 kg/m., she has been working on exercise, but has not been concentrating on her diet recently Wt Readings from Last 3 Encounters:  04/17/21 142 lb 12.8 oz (64.8 kg)  09/12/20 135 lb 12.8 oz (61.6 kg)  06/02/20 140 lb (63.5 kg)    She does not have BP cuff to check at home, typically well controlled in office without medication change in several years, today their BP is BP: 130/82  She does workout. She denies chest pain, shortness of breath, dizziness.    She is not on cholesterol medication, she had been aggressively working on lifestyle, but has not been doing as much exercise or following diet.  Has statin intolerance and never started the Zetia that was prescribed. . Her cholesterol is not at goal. The cholesterol last visit was:   Lab Results  Component Value Date   CHOL 213 (H) 09/12/2020   HDL 69 09/12/2020   LDLCALC 128 (H) 09/12/2020   TRIG 67 09/12/2020   CHOLHDL 3.1 09/12/2020    She has been working on diet and exercise for glucose management, and denies increased appetite, nausea, paresthesia of the  feet, polydipsia, polyuria and visual disturbances. Last A1C in the office was:  Lab Results  Component Value Date   HGBA1C 4.7 09/12/2020   Patient is on Vitamin D supplement.   Lab Results  Component Value Date   VD25OH 56 09/12/2020        Current Medications:  Current Outpatient Medications on File Prior to Visit  Medication Sig   atenolol (TENORMIN) 100 MG tablet Take 1 tablet  Daily for BP   azithromycin (ZITHROMAX) 250 MG tablet Take 2 tablets with Food on  Day 1, then 1 tablet Daily with Food for Infection   Cholecalciferol (VITAMIN D3) 50 MCG (2000 UT) TABS Takes 1 cap /day   hydrochlorothiazide (HYDRODIURIL) 25 MG tablet Take 1 tablet Daily for BP & Fluid Retention / Ankle Swelling   SUMAtriptan (IMITREX) 100 MG tablet Take 1 tablet immediately for Migraine & may repeat 1 x  / in 2 hours   - Maximum 2 tablets / 24 hours   traZODone (DESYREL) 150 MG tablet Take     1 tablet     1 hour      before Bedtime for Sleep   No current facility-administered medications on file prior to visit.     Allergies:  Allergies  Allergen Reactions   Crestor [Rosuvastatin]    Penicillins Rash   Sulfa Antibiotics Rash     Medical History:  Past Medical History:  Diagnosis Date   HSV-2 (herpes simplex virus 2) infection    Hypertension    Multiple body piercings    one in tongue-will take out for surgery-puts a plastic piece in   Vitamin B 12 deficiency    Family history- Reviewed and unchanged Social history- Reviewed and unchanged   Review of Systems:  Review of Systems  Constitutional:  Negative for chills, fever, malaise/fatigue and weight loss.  HENT:  Negative for congestion, ear discharge, hearing loss, sore throat and tinnitus.   Eyes:  Negative for blurred vision, double vision, discharge and redness.  Respiratory:  Negative for cough, shortness of breath and wheezing.   Cardiovascular:  Negative for chest pain, palpitations, orthopnea, claudication and leg swelling.  Gastrointestinal:  Negative for abdominal pain, blood in stool, constipation, diarrhea, heartburn, melena, nausea and vomiting.  Genitourinary: Negative.  Negative for dysuria, frequency and urgency.  Musculoskeletal:  Negative for back pain, falls, joint pain and myalgias.  Skin:  Negative for rash.  Neurological:  Negative for dizziness, tingling, sensory change, seizures, weakness and  headaches.  Endo/Heme/Allergies:  Negative for polydipsia. Does not bruise/bleed easily.  Psychiatric/Behavioral:  Negative for depression, hallucinations, memory loss and suicidal ideas. The patient has insomnia. The patient is not nervous/anxious.   All other systems reviewed and are negative.    Physical Exam: BP 130/82   Temp (!) 97.3 F (36.3 C)   Wt 142 lb 12.8 oz (64.8 kg)   BMI 22.37 kg/m  Wt Readings from Last 3 Encounters:  04/17/21 142 lb 12.8 oz (64.8 kg)  09/12/20 135 lb 12.8 oz (61.6 kg)  06/02/20 140 lb (63.5 kg)   General Appearance: Well nourished, well groomed and in no apparent distress. Eyes: PERRLA, EOMs, conjunctiva no swelling or erythema, normal fundi and vessels. Sinuses: No frontal/maxillary tenderness ENT/Mouth: EACs patent / TMs  nl. Hearing normal. Nares clear without erythema, swelling, mucoid exudates. Oral hygiene is good. No erythema, swelling, or exudate. Tongue piercing , otherwise normal, non-obstructing. Tonsils not swollen or erythematous. Neck: Supple, thyroid not palpable. No bruits, nodes or  JVD. Respiratory: Respiratory effort normal.  BS equal and clear bilateral without rales, rhonci, wheezing or stridor. Cardio: Heart sounds are normal with regular rate and rhythm and no murmurs, rubs or gallops. Peripheral pulses are normal and equal bilaterally without edema. No aortic or femoral bruits. Chest: symmetric with normal excursions and percussion. Breasts: Deferred to Gyn Abdomen: Flat, soft with bowel sounds active. Nontender, no guarding, rebound, hernias, masses, or organomegaly. Lymphatics: Non tender without lymphadenopathy. Genitourinary:  Deferred to Gyn Musculoskeletal: Full ROM all peripheral extremities, joint stability, 5/5 strength, and normal gait. Skin: Warm and dry without rashes, lesions, cyanosis, clubbing or  ecchymosis. Multiple tattoos. Neuro: Cranial nerves intact, reflexes equal bilaterally. Normal muscle tone, no  cerebellar symptoms. Sensation intact. Pysch: Alert and oriented X 3, normal affect, Insight and Judgment appropriate.  Revonda Humphrey, NP 4:28 PM [facility]

## 2021-04-17 ENCOUNTER — Encounter: Payer: Self-pay | Admitting: Nurse Practitioner

## 2021-04-17 ENCOUNTER — Other Ambulatory Visit: Payer: Self-pay

## 2021-04-17 ENCOUNTER — Ambulatory Visit: Payer: BC Managed Care – PPO | Admitting: Nurse Practitioner

## 2021-04-17 VITALS — BP 130/82 | Temp 97.3°F | Wt 142.8 lb

## 2021-04-17 DIAGNOSIS — R7309 Other abnormal glucose: Secondary | ICD-10-CM | POA: Diagnosis not present

## 2021-04-17 DIAGNOSIS — E782 Mixed hyperlipidemia: Secondary | ICD-10-CM

## 2021-04-17 DIAGNOSIS — Z79899 Other long term (current) drug therapy: Secondary | ICD-10-CM

## 2021-04-17 DIAGNOSIS — I10 Essential (primary) hypertension: Secondary | ICD-10-CM

## 2021-04-17 DIAGNOSIS — E559 Vitamin D deficiency, unspecified: Secondary | ICD-10-CM | POA: Diagnosis not present

## 2021-04-17 DIAGNOSIS — F419 Anxiety disorder, unspecified: Secondary | ICD-10-CM

## 2021-04-17 DIAGNOSIS — F411 Generalized anxiety disorder: Secondary | ICD-10-CM

## 2021-04-17 DIAGNOSIS — F32A Depression, unspecified: Secondary | ICD-10-CM

## 2021-04-17 DIAGNOSIS — F5105 Insomnia due to other mental disorder: Secondary | ICD-10-CM

## 2021-04-17 DIAGNOSIS — G43709 Chronic migraine without aura, not intractable, without status migrainosus: Secondary | ICD-10-CM | POA: Insufficient documentation

## 2021-04-17 DIAGNOSIS — Z1329 Encounter for screening for other suspected endocrine disorder: Secondary | ICD-10-CM

## 2021-04-17 MED ORDER — ALPRAZOLAM 0.5 MG PO TABS: ORAL_TABLET | ORAL | 0 refills | Status: DC

## 2021-04-17 NOTE — Patient Instructions (Signed)
GENERAL HEALTH GOALS   Know what a healthy weight is for you (roughly BMI <25) and aim to maintain this   Aim for 7+ servings of fruits and vegetables daily   70-80+ fluid ounces of water or unsweet tea for healthy kidneys   Limit to max 1 drink of alcohol per day; avoid smoking/tobacco   Limit animal fats in diet for cholesterol and heart health - choose grass fed whenever available   Avoid highly processed foods, and foods high in saturated/trans fats   Aim for low stress - take time to unwind and care for your mental health   Aim for 150 min of moderate intensity exercise weekly for heart health, and weights twice weekly for bone health   Aim for 7-9 hours of sleep daily  Alprazolam Tablets What is this medication? ALPRAZOLAM (al PRAY zoe lam) treats anxiety. It works by helping your nervoussystem calm down. It belongs to a group of medications called benzodiazepines. This medicine may be used for other purposes; ask your health care provider orpharmacist if you have questions. COMMON BRAND NAME(S): Xanax What should I tell my care team before I take this medication? They need to know if you have any of these conditions: Depression or other mental health disease History of alcohol or drug abuse or addiction Kidney disease Liver disease Lung disease, asthma, or breathing problem Seizures Suicidal thoughts, plans or attempt An unusual or allergic reaction to alprazolam, other benzodiazepines, foods, dyes, or preservatives Pregnant or trying to get pregnant Breast-feeding How should I use this medication? Take this medication by mouth. Take it as directed on the prescription label. Do not take it more often than directed. Keep taking it unless your care teamtells you to stop. A special MedGuide will be given to you by the pharmacist with eachprescription and refill. Be sure to read this information carefully each time. Talk to your care team about the use of this medication in  children. Specialcare may be needed. Patients over 47 years of age may have a stronger reaction and need a smallerdose. Overdosage: If you think you have taken too much of this medicine contact apoison control center or emergency room at once. NOTE: This medicine is only for you. Do not share this medicine with others. What if I miss a dose? If you miss a dose, take it as soon as you can. If it is almost time for yournext dose, take only that dose. Do not take double or extra doses. What may interact with this medication? Do not take this medication with any of the following: Certain antivirals for HIV or hepatitis Certain medications for fungal infections like ketoconazole, itraconazole, or posaconazole Clarithromycin Grapefruit juice Narcotic medications for cough Sodium oxybate This medication may also interact with the following: Alcohol Antihistamines for allergy, cough and cold Certain medications for anxiety or sleep Certain medications for depression like amitriptyline, fluoxetine, fluvoxamine, nefazodone, sertraline Certain medications for seizures like carbamazepine, phenobarbital, phenytoin, primidone Cimetidine Digoxin Erythromycin Female hormones, like estrogens or progestins and birth control pills, patches, rings, or injections General anesthetics like halothane, isoflurane, methoxyflurane, propofol Medications that relax muscles Narcotic medications for pain Phenothiazines like chlorpromazine, mesoridazine, prochlorperazine, thioridazine This list may not describe all possible interactions. Give your health care provider a list of all the medicines, herbs, non-prescription drugs, or dietary supplements you use. Also tell them if you smoke, drink alcohol, or use illegaldrugs. Some items may interact with your medicine. What should I watch for while using this medication?  Visit your care team for regular checks on your progress. Tell your care teamif your symptoms do not  start to get better or if they get worse. Do not stop taking except on your care team's advice. You may develop a severereaction. Your care team will tell you how much medication to take. You may get drowsy or dizzy. Do not drive, use machinery, or do anything that needs mental alertness until you know how this medication affects you. To reduce the risk of dizzy and fainting spells, do not stand or sit up quickly, especially if you are an older patient. Alcohol may increase dizziness anddrowsiness. Avoid alcoholic drinks. If you are taking another medication that also causes drowsiness, you may have more side effects. Give your care team a list of all medications you use. Your care team will tell you how much medication to take. Do not take more medication than directed. Call emergency services if you have problemsbreathing or unusual sleepiness. Women should inform their care team if they wish to become pregnant or think they might be pregnant. Do not breast-feed while taking this medication. Talkto your care team for more information. What side effects may I notice from receiving this medication? Side effects that you should report to your care team as soon as possible: Allergic reactions-skin rash, itching, hives, swelling of the face, lips, tongue, or throat CNS depression-slow or shallow breathing, shortness of breath, feeling faint, dizziness, confusion, trouble staying awake Thoughts of suicide or self-harm, worsening mood, feelings of depression Side effects that usually do not require medical attention (report to your careteam if they continue or are bothersome): Change in sex drive or performance Dizziness Drowsiness Nausea This list may not describe all possible side effects. Call your doctor for medical advice about side effects. You may report side effects to FDA at1-800-FDA-1088. Where should I keep my medication? Keep out of the reach of children and pets. This medication can be  abused. Keep it in a safe place to protect it from theft. Do not share it with anyone. It is only for you. Selling or giving away this medication is dangerous and againstthe law. Store at room temperature between 20 and 25 degrees C (68 and 77 degrees F).Get rid of any unused medication after the expiration date. This medication may cause harm and death if it is taken by other adults, children, or pets. It is important to get rid of the medication as soon as youno longer need it, or it is expired. You can do this in two ways: Take the medication to a medication take-back program. Check with your pharmacy or law enforcement to find a location. If you cannot return the medication, check the label or package insert to see if the medication should be thrown out in the garbage or flushed down the toilet. If you are not sure, ask your care team. If it is safe to put it in the trash, take the medication out of the container. Mix the medication with cat litter, dirt, coffee grounds, or other unwanted substance. Seal the mixture in a bag or container. Put it in the trash. NOTE: This sheet is a summary. It may not cover all possible information. If you have questions about this medicine, talk to your doctor, pharmacist, orhealth care provider.  2022 Elsevier/Gold Standard (2020-10-20 13:07:37)   LDL "bad" cholesterol   Normal is <100 and ideal/low risk is around 70.   Most cholesterol and saturated fat comes from animal sources - fatty meat,  egg yolks, high fat dairy- so recommend limiting your intake of these, if you consume, try to use leanest option possible in small portions. It's also very important to be conscious of highly processed foods - chips, pastries, oils, etc. Anything that comes with a label should indicate saturated fat content and continue to reduce this until LDL numbers are improved.  It's also important to increase the good things - particularly soluble fiber. Focus diet on whole foods,  eating mostly plant-based items - fruit/vegetables, beans, nuts/seeds (chia, ground flax, etc), avocado/avocado oil, olives/olive oil, and whole grains (old fashioned oats, quinoa, etc) in moderation. If not already doing so, you could also add a daily soluble fiber supplement such as citrucel or benefiber.

## 2021-04-18 DIAGNOSIS — E782 Mixed hyperlipidemia: Secondary | ICD-10-CM

## 2021-04-18 LAB — COMPLETE METABOLIC PANEL WITH GFR
AG Ratio: 2.2 (calc) (ref 1.0–2.5)
ALT: 14 U/L (ref 6–29)
AST: 22 U/L (ref 10–35)
Albumin: 4.2 g/dL (ref 3.6–5.1)
Alkaline phosphatase (APISO): 58 U/L (ref 37–153)
BUN: 14 mg/dL (ref 7–25)
CO2: 31 mmol/L (ref 20–32)
Calcium: 9 mg/dL (ref 8.6–10.4)
Chloride: 102 mmol/L (ref 98–110)
Creat: 0.95 mg/dL (ref 0.50–1.05)
Globulin: 1.9 g/dL (calc) (ref 1.9–3.7)
Glucose, Bld: 71 mg/dL (ref 65–99)
Potassium: 3.9 mmol/L (ref 3.5–5.3)
Sodium: 141 mmol/L (ref 135–146)
Total Bilirubin: 0.5 mg/dL (ref 0.2–1.2)
Total Protein: 6.1 g/dL (ref 6.1–8.1)
eGFR: 68 mL/min/{1.73_m2} (ref >=60)

## 2021-04-18 LAB — CBC WITH DIFFERENTIAL/PLATELET
Absolute Monocytes: 317 cells/uL (ref 200–950)
Basophils Absolute: 22 cells/uL (ref 0–200)
Basophils Relative: 0.5 %
Eosinophils Absolute: 70 cells/uL (ref 15–500)
Eosinophils Relative: 1.6 %
HCT: 39.9 % (ref 35.0–45.0)
Hemoglobin: 13.4 g/dL (ref 11.7–15.5)
Lymphs Abs: 1153 cells/uL (ref 850–3900)
MCH: 31.4 pg (ref 27.0–33.0)
MCHC: 33.6 g/dL (ref 32.0–36.0)
MCV: 93.4 fL (ref 80.0–100.0)
MPV: 10.5 fL (ref 7.5–12.5)
Monocytes Relative: 7.2 %
Neutro Abs: 2838 cells/uL (ref 1500–7800)
Neutrophils Relative %: 64.5 %
Platelets: 167 10*3/uL (ref 140–400)
RBC: 4.27 10*6/uL (ref 3.80–5.10)
RDW: 12.9 % (ref 11.0–15.0)
Total Lymphocyte: 26.2 %
WBC: 4.4 10*3/uL (ref 3.8–10.8)

## 2021-04-18 LAB — VITAMIN D 25 HYDROXY (VIT D DEFICIENCY, FRACTURES): Vit D, 25-Hydroxy: 124 ng/mL — ABNORMAL HIGH (ref 30–100)

## 2021-04-18 LAB — MAGNESIUM: Magnesium: 1.8 mg/dL (ref 1.5–2.5)

## 2021-04-18 LAB — LIPID PANEL
Cholesterol: 202 mg/dL — ABNORMAL HIGH (ref <200)
HDL: 63 mg/dL (ref >=50)
LDL Cholesterol (Calc): 109 mg/dL (calc) — ABNORMAL HIGH
Non-HDL Cholesterol (Calc): 139 mg/dL (calc) — ABNORMAL HIGH (ref <130)
Total CHOL/HDL Ratio: 3.2 (calc) (ref <5.0)
Triglycerides: 180 mg/dL — ABNORMAL HIGH (ref <150)

## 2021-04-18 LAB — TSH: TSH: 1.26 mIU/L (ref 0.40–4.50)

## 2021-04-18 MED ORDER — EZETIMIBE 10 MG PO TABS: 10.0000 mg | ORAL_TABLET | Freq: Every day | ORAL | 11 refills | Status: DC

## 2021-06-06 ENCOUNTER — Other Ambulatory Visit: Payer: Self-pay | Admitting: Nurse Practitioner

## 2021-06-06 DIAGNOSIS — F411 Generalized anxiety disorder: Secondary | ICD-10-CM

## 2021-06-18 ENCOUNTER — Other Ambulatory Visit: Payer: Self-pay | Admitting: Internal Medicine

## 2021-06-18 DIAGNOSIS — G441 Vascular headache, not elsewhere classified: Secondary | ICD-10-CM

## 2021-06-18 MED ORDER — SUMATRIPTAN SUCCINATE 100 MG PO TABS: ORAL_TABLET | ORAL | 3 refills | Status: DC

## 2021-07-20 ENCOUNTER — Other Ambulatory Visit: Payer: Self-pay | Admitting: Adult Health

## 2021-07-20 DIAGNOSIS — F411 Generalized anxiety disorder: Secondary | ICD-10-CM

## 2021-07-24 ENCOUNTER — Other Ambulatory Visit: Payer: Self-pay | Admitting: Adult Health

## 2021-07-24 DIAGNOSIS — F411 Generalized anxiety disorder: Secondary | ICD-10-CM

## 2021-07-25 ENCOUNTER — Other Ambulatory Visit: Payer: Self-pay | Admitting: Nurse Practitioner

## 2021-07-25 DIAGNOSIS — F411 Generalized anxiety disorder: Secondary | ICD-10-CM

## 2021-07-25 MED ORDER — ALPRAZOLAM 0.5 MG PO TABS: ORAL_TABLET | ORAL | 0 refills | Status: DC

## 2021-08-03 ENCOUNTER — Other Ambulatory Visit: Payer: Self-pay | Admitting: Adult Health

## 2021-09-02 ENCOUNTER — Other Ambulatory Visit: Payer: Self-pay | Admitting: Nurse Practitioner

## 2021-09-02 DIAGNOSIS — F411 Generalized anxiety disorder: Secondary | ICD-10-CM

## 2021-09-21 ENCOUNTER — Encounter: Payer: BC Managed Care – PPO | Admitting: Internal Medicine

## 2021-09-27 ENCOUNTER — Encounter: Payer: BC Managed Care – PPO | Admitting: Internal Medicine

## 2021-10-11 ENCOUNTER — Other Ambulatory Visit: Payer: Self-pay | Admitting: Nurse Practitioner

## 2021-10-11 ENCOUNTER — Other Ambulatory Visit: Payer: Self-pay

## 2021-10-11 ENCOUNTER — Encounter: Payer: Self-pay | Admitting: Adult Health

## 2021-10-11 ENCOUNTER — Ambulatory Visit: Payer: BC Managed Care – PPO | Admitting: Adult Health

## 2021-10-11 VITALS — BP 104/76 | HR 64 | Temp 97.5°F | Ht 66.0 in | Wt 148.8 lb

## 2021-10-11 DIAGNOSIS — G43709 Chronic migraine without aura, not intractable, without status migrainosus: Secondary | ICD-10-CM

## 2021-10-11 DIAGNOSIS — Z136 Encounter for screening for cardiovascular disorders: Secondary | ICD-10-CM | POA: Diagnosis not present

## 2021-10-11 DIAGNOSIS — Z79899 Other long term (current) drug therapy: Secondary | ICD-10-CM

## 2021-10-11 DIAGNOSIS — F321 Major depressive disorder, single episode, moderate: Secondary | ICD-10-CM | POA: Insufficient documentation

## 2021-10-11 DIAGNOSIS — Z5320 Procedure and treatment not carried out because of patient's decision for unspecified reasons: Secondary | ICD-10-CM

## 2021-10-11 DIAGNOSIS — E559 Vitamin D deficiency, unspecified: Secondary | ICD-10-CM | POA: Diagnosis not present

## 2021-10-11 DIAGNOSIS — Z130 Encounter for screening for diseases of the blood and blood-forming organs and certain disorders involving the immune mechanism: Secondary | ICD-10-CM

## 2021-10-11 DIAGNOSIS — R7309 Other abnormal glucose: Secondary | ICD-10-CM

## 2021-10-11 DIAGNOSIS — E538 Deficiency of other specified B group vitamins: Secondary | ICD-10-CM

## 2021-10-11 DIAGNOSIS — E7800 Pure hypercholesterolemia, unspecified: Secondary | ICD-10-CM

## 2021-10-11 DIAGNOSIS — Z0001 Encounter for general adult medical examination with abnormal findings: Secondary | ICD-10-CM

## 2021-10-11 DIAGNOSIS — I10 Essential (primary) hypertension: Secondary | ICD-10-CM

## 2021-10-11 DIAGNOSIS — Z1329 Encounter for screening for other suspected endocrine disorder: Secondary | ICD-10-CM

## 2021-10-11 DIAGNOSIS — Z13220 Encounter for screening for lipoid disorders: Secondary | ICD-10-CM | POA: Diagnosis not present

## 2021-10-11 DIAGNOSIS — Z1389 Encounter for screening for other disorder: Secondary | ICD-10-CM

## 2021-10-11 DIAGNOSIS — Z131 Encounter for screening for diabetes mellitus: Secondary | ICD-10-CM

## 2021-10-11 DIAGNOSIS — Z0000 Encounter for general adult medical examination without abnormal findings: Secondary | ICD-10-CM | POA: Diagnosis not present

## 2021-10-11 DIAGNOSIS — Z8249 Family history of ischemic heart disease and other diseases of the circulatory system: Secondary | ICD-10-CM

## 2021-10-11 DIAGNOSIS — F338 Other recurrent depressive disorders: Secondary | ICD-10-CM | POA: Insufficient documentation

## 2021-10-11 DIAGNOSIS — F5104 Psychophysiologic insomnia: Secondary | ICD-10-CM

## 2021-10-11 DIAGNOSIS — F411 Generalized anxiety disorder: Secondary | ICD-10-CM

## 2021-10-11 DIAGNOSIS — Z6824 Body mass index (BMI) 24.0-24.9, adult: Secondary | ICD-10-CM

## 2021-10-11 MED ORDER — BUPROPION HCL ER (XL) 150 MG PO TB24: 150.0000 mg | ORAL_TABLET | ORAL | 0 refills | Status: DC

## 2021-10-11 NOTE — Patient Instructions (Addendum)
Brooke Wu , Thank you for taking time to come for your Annual Wellness Visit. I appreciate your ongoing commitment to your health goals. Please review the following plan we discussed and let me know if I can assist you in the future.   These are the goals we discussed:  Goals      Exercise 150 min/wk Moderate Activity        This is a list of the screening recommended for you and due dates:  Health Maintenance  Topic Date Due   Pneumococcal Vaccination (1 - PCV) Never done   Zoster (Shingles) Vaccine (1 of 2) Never done   Colon Cancer Screening  Never done   Mammogram  07/01/2019   COVID-19 Vaccine (3 - Pfizer risk series) 02/08/2020   Flu Shot  05/07/2021   Pap Smear  09/20/2023   Tetanus Vaccine  08/09/2029   Hepatitis C Screening: USPSTF Recommendation to screen - Ages 18-79 yo.  Completed   HIV Screening  Completed   HPV Vaccine  Aged Out   Please send covid 19 dates  Recommend eye exam - call around and shop for cheapest cash pay, want glaucoma check at minimum  Please let me know exact vitamin D and B12 dose/frequency      Know what a healthy weight is for you (roughly BMI <25) and aim to maintain this  Aim for 7+ servings of fruits and vegetables daily  65-80+ fluid ounces of water or unsweet tea for healthy kidneys  Limit to max 1 drink of alcohol per day; avoid smoking/tobacco  Limit animal fats in diet for cholesterol and heart health - choose grass fed whenever available  Avoid highly processed foods, and foods high in saturated/trans fats  Aim for low stress - take time to unwind and care for your mental health  Aim for 150 min of moderate intensity exercise weekly for heart health, and weights twice weekly for bone health  Aim for 7-9 hours of sleep daily   Zoster Vaccine, Recombinant injection What is this medication? ZOSTER VACCINE (ZOS ter vak SEEN) is a vaccine used to reduce the risk of getting shingles. This vaccine is not used to treat  shingles or nerve pain from shingles. This medicine may be used for other purposes; ask your health care provider or pharmacist if you have questions. COMMON BRAND NAME(S): [facility] What should I tell my care team before I take this medication? They need to know if you have any of these conditions: cancer immune system problems an unusual or allergic reaction to Zoster vaccine, other medications, foods, dyes, or preservatives pregnant or trying to get pregnant breast-feeding How should I use this medication? This vaccine is injected into a muscle. It is given by a health care provider. A copy of Vaccine Information Statements will be given before each vaccination. Be sure to read this information carefully each time. This sheet may change often. Talk to your health care provider about the use of this vaccine in children. This vaccine is not approved for use in children. Overdosage: If you think you have taken too much of this medicine contact a poison control center or emergency room at once. NOTE: This medicine is only for you. Do not share this medicine with others. What if I miss a dose? Keep appointments for follow-up (booster) doses. It is important not to miss your dose. Call your health care provider if you are unable to keep an appointment. What may interact with this medication? medicines that  suppress your immune system medicines to treat cancer steroid medicines like prednisone or cortisone This list may not describe all possible interactions. Give your health care provider a list of all the medicines, herbs, non-prescription drugs, or dietary supplements you use. Also tell them if you smoke, drink alcohol, or use illegal drugs. Some items may interact with your medicine. What should I watch for while using this medication? Visit your health care provider regularly. This vaccine, like all vaccines, may not fully protect everyone. What side effects may I notice from receiving this  medication? Side effects that you should report to your doctor or health care professional as soon as possible: allergic reactions (skin rash, itching or hives; swelling of the face, lips, or tongue) trouble breathing Side effects that usually do not require medical attention (report these to your doctor or health care professional if they continue or are bothersome): chills headache fever nausea pain, redness, or irritation at site where injected tiredness vomiting This list may not describe all possible side effects. Call your doctor for medical advice about side effects. You may report side effects to FDA at 1-800-FDA-1088. Where should I keep my medication? This vaccine is only given by a health care provider. It will not be stored at home. NOTE: This sheet is a summary. It may not cover all possible information. If you have questions about this medicine, talk to your doctor, pharmacist, or health care provider.  2022 Elsevier/Gold Standard (2021-06-12 00:00:00)

## 2021-10-11 NOTE — Progress Notes (Signed)
Complete Physical  Assessment and Plan:  Encounter for Annual Physical Exam with abnormal findings Due annually  Health Maintenance reviewed Healthy lifestyle reviewed and goals set  Hypertension Continue meds Monitor blood pressure at home; patient to call if consistently greater than 130/80 Continue DASH diet.   Reminder to go to the ER if any CP, SOB, nausea, dizziness, severe HA, changes vision/speech, left arm numbness and tingling and jaw pain.  Hyperlipidemia Last check above goal; she has since restarted strict diet that has worked well for her for years Declines all medications, hx of crestor and zetia intolerance Continue low cholesterol diet and exercise.  Check lipid panel at next OV   Chronic migraine without aura  Imrpoved, imitrex PRN works well with avoiding triggers  Abnormal glucose Recent A1Cs at goal Discussed diet/exercise, weight management  A1C deferred per patient preference  Vitamin D Def       Above goal at last visit; has reduced supplement       Check Vit D level   B12 Deficiency Newly on supplement; recheck   MDD (HCC)/ Seasonal depression (HCC)/ insomnia Lexapro/trazodone was helping but notes seasonal element; will try adding wellbutrin seasonally after disucssion Disucssed risk of SS, stop med if concerns       Stress management techniques discussed, increase water, good sleep hygiene discussed, increase exercise, and increase veggies.        She may benefit from therapy/counseling       Briefly discussed risks of long term benzo use, try slow taper, skip when able Follow up with progress in 1-2 months   BMI 24 Continue to recommend diet heavy in fruits and veggies and low in animal meats, cheeses, and dairy products, appropriate calorie intake Discuss exercise recommendations routinely Continue to monitor weight at each visit  Orders Placed This Encounter  Procedures   CBC with Differential/Platelet   COMPLETE METABOLIC PANEL WITH  GFR   Magnesium   Lipid panel   TSH   VITAMIN D 25 Hydroxy (Vit-D Deficiency, Fractures)   Vitamin B12   Microalbumin / creatinine urine ratio   Urinalysis, Routine w reflex microscopic   EKG 12-Lead      Discussed med's effects and SE's. Screening labs and tests as requested with regular follow-up as recommended. Over 40 minutes of exam, counseling, chart review, and complex, high level critical decision making was performed this visit.   Future Appointments  Date Time Provider Department Center  04/11/2022  9:30 AM Revonda Humphrey, NP [facility] None  10/15/2022  2:00 PM Judd Gaudier, NP [facility] None     HPI  61 y.o. female  presents for a complete physical and follow up for has HSV-2 (herpes simplex virus 2) infection; Vitamin B 12 deficiency; Essential hypertension; Vitamin D deficiency; Elevated cholesterol; Abnormal glucose; Medication management; Insomnia; Chronic migraine without aura without status migrainosus, not intractable; and Current moderate episode of major depressive disorder without prior episode (HCC) on their problem list.  Divorced. She has new boyfriend, 2 grown children, last few months have been busy, son is problem child and keeping her busy.   She follows with [facility] for PAP, has upcoming appointment in March. She declines STD testing.   She has a diagnosis of anxiety/insomnia and is currently on xanax 0.5-1 mg, trazodone 75 mg, reports symptoms are well controlled on current regimen. She takes mainly at night for sleep.   She has migraines without aura, has improved recnetly, takes imitrex, 2-3 per month.   BMI  is Body mass index is 24.02 kg/m., she has not been working on diet and exercise. Was walking regularly until the last few months, does meet with a trainer a few days a week, plans to increase activity back up.   Wt Readings from Last 3 Encounters:  10/11/21 148 lb 12.8 oz (67.5 kg)  04/17/21 142 lb 12.8 oz (64.8 kg)  09/12/20 135 lb  12.8 oz (61.6 kg)   Her blood pressure has been controlled at home, today their BP is BP: 104/76 She does not workout. She denies chest pain, shortness of breath, dizziness.   She is not on cholesterol medication (hx of rosuvastatin intolerance - sick to stomach and dizzy, prescribed zetia 10 mg daily but had aching) and denies myalgias. Her cholesterol is not at goal. The cholesterol last visit was:   Lab Results  Component Value Date   CHOL 202 (H) 04/17/2021   HDL 63 04/17/2021   LDLCALC 109 (H) 04/17/2021   TRIG 180 (H) 04/17/2021   CHOLHDL 3.2 04/17/2021   She has not been working on diet and exercise for glucose management, and denies polydipsia, polyuria, and visual disturbances. Last A1C in the office was:  Lab Results  Component Value Date   HGBA1C 4.7 09/12/2020   Last GFR: Lab Results  Component Value Date   GFRNONAA 74 09/12/2020   Patient is on Vitamin D supplement, has reduced from daily to a few days a week, unsure of exact dose.   Lab Results  Component Value Date   VD25OH 124 (H) 04/17/2021     She is newly on SL B12 daily, unsure of dose.  Lab Results  Component Value Date   VITAMINB12 373 09/12/2020     Current Medications:  Current Outpatient Medications on File Prior to Visit  Medication Sig Dispense Refill   ALPRAZolam (XANAX) 0.5 MG tablet TAKE 1/2 TO 1 TABLET BY MOUTH TWICE DAILY TO THREE TIMES DAILY AS NEEDED FOR ANXIETY(LIMIT TO 5 DAYS PER WEEK) 45 tablet 0   atenolol (TENORMIN) 100 MG tablet Take 1 tablet Daily for BP 90 tablet 3   Cholecalciferol (VITAMIN D3) 50 MCG (2000 UT) TABS Takes 1 cap /day     escitalopram (LEXAPRO) 10 MG tablet Take 10 mg by mouth daily.     hydrochlorothiazide (HYDRODIURIL) 25 MG tablet Take 1 tablet Daily for BP & Fluid Retention / Ankle Swelling 90 tablet 3   SUMAtriptan (IMITREX) 100 MG tablet Take 1 tablet immediately for Migraine & may repeat 1 x  / in 2 hours   - Maximum 2 tablets / 24 hours 30 tablet 3    traZODone (DESYREL) 150 MG tablet Take     1 tablet     1 hour      before Bedtime for Sleep 90 tablet 0   No current facility-administered medications on file prior to visit.   Allergies:  Allergies  Allergen Reactions   Crestor [Rosuvastatin]    Zetia [Ezetimibe]     Arthralgias    Penicillins Rash   Sulfa Antibiotics Rash   Medical History:  She has HSV-2 (herpes simplex virus 2) infection; Vitamin B 12 deficiency; Essential hypertension; Vitamin D deficiency; Elevated cholesterol; Abnormal glucose; Medication management; Insomnia; Chronic migraine without aura without status migrainosus, not intractable; and Current moderate episode of major depressive disorder without prior episode (HCC) on their problem list.  Health Maintenance:   Immunization History  Administered Date(s) Administered   Influenza Inj Mdck Quad With Preservative 07/27/2018, 08/10/2019,  09/12/2020   PFIZER(Purple Top)SARS-COV-2 Vaccination 12/21/2019, 01/11/2020   PPD Test 04/14/2014, 04/18/2015, 05/07/2016, 07/01/2017, 07/27/2018, 08/10/2019, 09/12/2020   Td 08/10/2019   Tdap 02/07/2009    Tetanus: 08/2019 Prevnar 20: get age 61 Flu vaccine: TODAY Shingrix: discussed get at pharmacy  Covid 19: 2/2, 2021 pfizer + booster - date requested  Pap: 09/19/2020, [facility], 3 years MGM: Getting annually at GYN  DEXA: start age 61  Colonoscopy: declines at this time, declines cologuard  Last Dental Exam: Out of insurance, last in 2012 Last Eye Exam: remote, no insurance  Patient Care Team: Lucky CowboyMcKeown, William, MD as PCP - General (Internal Medicine)  Surgical History:  She has a past surgical history that includes ORIF ankle fracture (11/05/2012). Family History:  Herfamily history includes Aortic aneurysm in her mother; Brain cancer in her maternal grandfather; CAD in her father; CAD (age of onset: 926) in her mother; COPD in her mother; Diabetes in her father; Heart disease in her maternal grandmother;  Heart failure in her father; Hypertension in her father; Lung cancer (age of onset: 4870) in her father. Social History:  She reports that she has never smoked. She has never used smokeless tobacco. She reports current alcohol use of about 3.0 standard drinks per week. She reports that she does not use drugs.  Review of Systems: Review of Systems  Constitutional:  Negative for malaise/fatigue and weight loss.  HENT:  Negative for hearing loss and tinnitus.   Eyes:  Negative for blurred vision and double vision.  Respiratory:  Negative for cough, sputum production, shortness of breath and wheezing.   Cardiovascular:  Negative for chest pain, palpitations, orthopnea, claudication, leg swelling and PND.  Gastrointestinal:  Negative for abdominal pain, blood in stool, constipation, diarrhea, heartburn, melena, nausea and vomiting.  Genitourinary: Negative.   Musculoskeletal:  Negative for falls, joint pain and myalgias.  Skin:  Negative for rash.  Neurological:  Negative for dizziness, tingling, sensory change, weakness and headaches.  Endo/Heme/Allergies:  Negative for polydipsia.  Psychiatric/Behavioral:  Positive for depression (worse with winter season). Negative for memory loss, substance abuse and suicidal ideas. The patient is not nervous/anxious and does not have insomnia.   All other systems reviewed and are negative.  Physical Exam: Estimated body mass index is 24.02 kg/m as calculated from the following:   Height as of this encounter: 5\' 6"  (1.676 m).   Weight as of this encounter: 148 lb 12.8 oz (67.5 kg). BP 104/76    Pulse 64    Temp (!) 97.5 F (36.4 C)    Ht 5\' 6"  (1.676 m)    Wt 148 lb 12.8 oz (67.5 kg)    SpO2 94%    BMI 24.02 kg/m  General Appearance: Well nourished, in no apparent distress.  Eyes: PERRLA, EOMs, conjunctiva no swelling or erythema Sinuses: No Frontal/maxillary tenderness  ENT/Mouth: Ext aud canals clear, normal light reflex with TMs without erythema,  bulging. Good dentition. No erythema, swelling, or exudate on post pharynx. Tonsils not swollen or erythematous. Hearing normal.  Neck: Supple, thyroid normal. No bruits  Respiratory: Respiratory effort normal, BS equal bilaterally without rales, rhonchi, wheezing or stridor.  Cardio: RRR without murmurs, rubs or gallops. Brisk peripheral pulses without edema.  Chest: symmetric, with normal excursions and percussion.  Breasts: defer to GYN Abdomen: Soft, nontender, no guarding, rebound, hernias, masses, or organomegaly.  Lymphatics: Non tender without lymphadenopathy.  Genitourinary: defer to GYN Musculoskeletal: Full ROM all peripheral extremities,5/5 strength, and normal gait.  Skin: Warm,  dry without rashes, lesions, ecchymosis. Neuro: Cranial nerves intact, reflexes equal bilaterally. Normal muscle tone, no cerebellar symptoms. Sensation intact.  Psych: Awake and oriented X 3, depressed affect, Insight and Judgment appropriate.   EKG: Sinus bradycardia  Dan Maker, NP 5:23 PM [facility]

## 2021-10-12 ENCOUNTER — Other Ambulatory Visit: Payer: Self-pay | Admitting: Adult Health

## 2021-10-12 ENCOUNTER — Encounter: Payer: Self-pay | Admitting: Adult Health

## 2021-10-12 LAB — URINALYSIS, ROUTINE W REFLEX MICROSCOPIC
Bacteria, UA: NONE SEEN /HPF
Bilirubin Urine: NEGATIVE
Glucose, UA: NEGATIVE
Hgb urine dipstick: NEGATIVE
Hyaline Cast: NONE SEEN /LPF
Ketones, ur: NEGATIVE
Nitrite: NEGATIVE
Protein, ur: NEGATIVE
RBC / HPF: NONE SEEN /HPF (ref 0–2)
Specific Gravity, Urine: 1.014 (ref 1.001–1.035)
Squamous Epithelial / HPF: NONE SEEN /HPF (ref <=5)
pH: 6 (ref 5.0–8.0)

## 2021-10-12 LAB — CBC WITH DIFFERENTIAL/PLATELET
Absolute Monocytes: 398 cells/uL (ref 200–950)
Basophils Absolute: 20 cells/uL (ref 0–200)
Basophils Relative: 0.4 %
Eosinophils Absolute: 107 cells/uL (ref 15–500)
Eosinophils Relative: 2.1 %
HCT: 40.8 % (ref 35.0–45.0)
Hemoglobin: 13.9 g/dL (ref 11.7–15.5)
Lymphs Abs: 1331 cells/uL (ref 850–3900)
MCH: 31.4 pg (ref 27.0–33.0)
MCHC: 34.1 g/dL (ref 32.0–36.0)
MCV: 92.3 fL (ref 80.0–100.0)
MPV: 10.5 fL (ref 7.5–12.5)
Monocytes Relative: 7.8 %
Neutro Abs: 3244 cells/uL (ref 1500–7800)
Neutrophils Relative %: 63.6 %
Platelets: 202 10*3/uL (ref 140–400)
RBC: 4.42 10*6/uL (ref 3.80–5.10)
RDW: 12.3 % (ref 11.0–15.0)
Total Lymphocyte: 26.1 %
WBC: 5.1 10*3/uL (ref 3.8–10.8)

## 2021-10-12 LAB — MICROALBUMIN / CREATININE URINE RATIO
Creatinine, Urine: 65 mg/dL (ref 20–275)
Microalb Creat Ratio: 5 mcg/mg creat (ref <30)
Microalb, Ur: 0.3 mg/dL

## 2021-10-12 LAB — LIPID PANEL
Cholesterol: 218 mg/dL — ABNORMAL HIGH (ref <200)
HDL: 64 mg/dL (ref >=50)
LDL Cholesterol (Calc): 132 mg/dL (calc) — ABNORMAL HIGH
Non-HDL Cholesterol (Calc): 154 mg/dL (calc) — ABNORMAL HIGH (ref <130)
Total CHOL/HDL Ratio: 3.4 (calc) (ref <5.0)
Triglycerides: 114 mg/dL (ref <150)

## 2021-10-12 LAB — VITAMIN D 25 HYDROXY (VIT D DEFICIENCY, FRACTURES): Vit D, 25-Hydroxy: 64 ng/mL (ref 30–100)

## 2021-10-12 LAB — COMPLETE METABOLIC PANEL WITH GFR
AG Ratio: 2.3 (calc) (ref 1.0–2.5)
ALT: 12 U/L (ref 6–29)
AST: 21 U/L (ref 10–35)
Albumin: 4.3 g/dL (ref 3.6–5.1)
Alkaline phosphatase (APISO): 62 U/L (ref 37–153)
BUN: 13 mg/dL (ref 7–25)
CO2: 35 mmol/L — ABNORMAL HIGH (ref 20–32)
Calcium: 9.3 mg/dL (ref 8.6–10.4)
Chloride: 100 mmol/L (ref 98–110)
Creat: 0.83 mg/dL (ref 0.50–1.05)
Globulin: 1.9 g/dL (calc) (ref 1.9–3.7)
Glucose, Bld: 96 mg/dL (ref 65–99)
Potassium: 3.8 mmol/L (ref 3.5–5.3)
Sodium: 141 mmol/L (ref 135–146)
Total Bilirubin: 0.5 mg/dL (ref 0.2–1.2)
Total Protein: 6.2 g/dL (ref 6.1–8.1)
eGFR: 80 mL/min/{1.73_m2} (ref >=60)

## 2021-10-12 LAB — VITAMIN B12: Vitamin B-12: 876 pg/mL (ref 200–1100)

## 2021-10-12 LAB — TSH: TSH: 1.59 mIU/L (ref 0.40–4.50)

## 2021-10-12 LAB — MAGNESIUM: Magnesium: 1.7 mg/dL (ref 1.5–2.5)

## 2021-10-12 LAB — MICROSCOPIC MESSAGE

## 2021-10-12 MED ORDER — B-12 5000 MCG SL SUBL: 1.0000 | SUBLINGUAL_TABLET | Freq: Every day | SUBLINGUAL | Status: DC

## 2021-10-12 MED ORDER — BUPROPION HCL 75 MG PO TABS: 75.0000 mg | ORAL_TABLET | Freq: Two times a day (BID) | ORAL | 2 refills | Status: DC

## 2021-10-12 MED ORDER — CHOLECALCIFEROL 250 MCG (10000 UT) PO CAPS: 1.0000 | ORAL_CAPSULE | ORAL | 0 refills | Status: DC

## 2021-11-21 ENCOUNTER — Other Ambulatory Visit: Payer: Self-pay | Admitting: Adult Health

## 2021-11-21 DIAGNOSIS — F411 Generalized anxiety disorder: Secondary | ICD-10-CM

## 2021-11-22 ENCOUNTER — Other Ambulatory Visit: Payer: Self-pay | Admitting: Adult Health

## 2021-12-14 DIAGNOSIS — Z01419 Encounter for gynecological examination (general) (routine) without abnormal findings: Secondary | ICD-10-CM | POA: Diagnosis not present

## 2021-12-14 DIAGNOSIS — R8279 Other abnormal findings on microbiological examination of urine: Secondary | ICD-10-CM | POA: Diagnosis not present

## 2021-12-14 DIAGNOSIS — Z124 Encounter for screening for malignant neoplasm of cervix: Secondary | ICD-10-CM | POA: Diagnosis not present

## 2021-12-14 DIAGNOSIS — Z130 Encounter for screening for diseases of the blood and blood-forming organs and certain disorders involving the immune mechanism: Secondary | ICD-10-CM | POA: Diagnosis not present

## 2021-12-14 DIAGNOSIS — Z1231 Encounter for screening mammogram for malignant neoplasm of breast: Secondary | ICD-10-CM | POA: Diagnosis not present

## 2021-12-14 DIAGNOSIS — N898 Other specified noninflammatory disorders of vagina: Secondary | ICD-10-CM | POA: Diagnosis not present

## 2022-02-26 ENCOUNTER — Other Ambulatory Visit: Payer: Self-pay | Admitting: Adult Health

## 2022-04-01 ENCOUNTER — Ambulatory Visit: Payer: BC Managed Care – PPO | Admitting: Nurse Practitioner

## 2022-04-01 ENCOUNTER — Encounter: Payer: Self-pay | Admitting: Nurse Practitioner

## 2022-04-01 VITALS — BP 118/70 | HR 80 | Temp 97.7°F | Wt 148.0 lb

## 2022-04-01 DIAGNOSIS — I10 Essential (primary) hypertension: Secondary | ICD-10-CM

## 2022-04-01 DIAGNOSIS — Z6823 Body mass index (BMI) 23.0-23.9, adult: Secondary | ICD-10-CM

## 2022-04-01 DIAGNOSIS — Z79899 Other long term (current) drug therapy: Secondary | ICD-10-CM

## 2022-04-01 DIAGNOSIS — R7309 Other abnormal glucose: Secondary | ICD-10-CM

## 2022-04-01 DIAGNOSIS — F5105 Insomnia due to other mental disorder: Secondary | ICD-10-CM

## 2022-04-01 DIAGNOSIS — E559 Vitamin D deficiency, unspecified: Secondary | ICD-10-CM

## 2022-04-01 DIAGNOSIS — T7840XA Allergy, unspecified, initial encounter: Secondary | ICD-10-CM

## 2022-04-01 DIAGNOSIS — G43709 Chronic migraine without aura, not intractable, without status migrainosus: Secondary | ICD-10-CM | POA: Diagnosis not present

## 2022-04-01 DIAGNOSIS — E782 Mixed hyperlipidemia: Secondary | ICD-10-CM

## 2022-04-01 DIAGNOSIS — F419 Anxiety disorder, unspecified: Secondary | ICD-10-CM

## 2022-04-01 MED ORDER — DEXAMETHASONE 4 MG PO TABS: ORAL_TABLET | ORAL | 0 refills | Status: DC

## 2022-04-02 LAB — COMPLETE METABOLIC PANEL WITH GFR
AG Ratio: 2.3 (calc) (ref 1.0–2.5)
ALT: 18 U/L (ref 6–29)
AST: 21 U/L (ref 10–35)
Albumin: 4.1 g/dL (ref 3.6–5.1)
Alkaline phosphatase (APISO): 61 U/L (ref 37–153)
BUN: 14 mg/dL (ref 7–25)
CO2: 30 mmol/L (ref 20–32)
Calcium: 9.2 mg/dL (ref 8.6–10.4)
Chloride: 103 mmol/L (ref 98–110)
Creat: 0.89 mg/dL (ref 0.50–1.05)
Globulin: 1.8 g/dL (calc) — ABNORMAL LOW (ref 1.9–3.7)
Glucose, Bld: 94 mg/dL (ref 65–99)
Potassium: 3.9 mmol/L (ref 3.5–5.3)
Sodium: 141 mmol/L (ref 135–146)
Total Bilirubin: 0.5 mg/dL (ref 0.2–1.2)
Total Protein: 5.9 g/dL — ABNORMAL LOW (ref 6.1–8.1)
eGFR: 73 mL/min/{1.73_m2} (ref >=60)

## 2022-04-02 LAB — LIPID PANEL
Cholesterol: 187 mg/dL
HDL: 59 mg/dL
LDL Cholesterol (Calc): 101 mg/dL — ABNORMAL HIGH
Non-HDL Cholesterol (Calc): 128 mg/dL
Total CHOL/HDL Ratio: 3.2 (calc)
Triglycerides: 170 mg/dL — ABNORMAL HIGH

## 2022-04-02 LAB — CBC WITH DIFFERENTIAL/PLATELET
Absolute Monocytes: 335 cells/uL (ref 200–950)
Basophils Absolute: 20 cells/uL (ref 0–200)
Basophils Relative: 0.4 %
Eosinophils Absolute: 100 cells/uL (ref 15–500)
Eosinophils Relative: 2 %
HCT: 41 % (ref 35.0–45.0)
Hemoglobin: 14.1 g/dL (ref 11.7–15.5)
Lymphs Abs: 1230 cells/uL (ref 850–3900)
MCH: 31.8 pg (ref 27.0–33.0)
MCHC: 34.4 g/dL (ref 32.0–36.0)
MCV: 92.3 fL (ref 80.0–100.0)
MPV: 10.3 fL (ref 7.5–12.5)
Monocytes Relative: 6.7 %
Neutro Abs: 3315 cells/uL (ref 1500–7800)
Neutrophils Relative %: 66.3 %
Platelets: 191 10*3/uL (ref 140–400)
RBC: 4.44 10*6/uL (ref 3.80–5.10)
RDW: 12.6 % (ref 11.0–15.0)
Total Lymphocyte: 24.6 %
WBC: 5 10*3/uL (ref 3.8–10.8)

## 2022-04-02 LAB — HEMOGLOBIN A1C
Hgb A1c MFr Bld: 4.9 %{Hb}
Mean Plasma Glucose: 94 mg/dL
eAG (mmol/L): 5.2 mmol/L

## 2022-04-11 ENCOUNTER — Ambulatory Visit: Payer: BC Managed Care – PPO | Admitting: Nurse Practitioner

## 2022-04-23 NOTE — Progress Notes (Unsigned)
Assessment and Plan:  There are no diagnoses linked to this encounter.    Further disposition pending results of labs. Discussed med's effects and SE's.   Over 30 minutes of exam, counseling, chart review, and critical decision making was performed.   Future Appointments  Date Time Provider Department Center  04/24/2022  3:45 PM Raynelle Dick, NP [facility] None  10/15/2022  2:00 PM Adela Glimpse, NP [facility] None    ------------------------------------------------------------------------------------------------------------------   HPI There were no vitals taken for this visit. 60 y.o.female presents for  Past Medical History:  Diagnosis Date   HSV-2 (herpes simplex virus 2) infection    Hypertension    Multiple body piercings    one in tongue-will take out for surgery-puts a plastic piece in   Vitamin B 12 deficiency      Allergies  Allergen Reactions   Crestor [Rosuvastatin]    Zetia [Ezetimibe]     Arthralgias    Penicillins Rash   Sulfa Antibiotics Rash    Current Outpatient Medications on File Prior to Visit  Medication Sig   atenolol (TENORMIN) 100 MG tablet Take 1 tablet Daily for BP   dexamethasone (DECADRON) 4 MG tablet Take 3 tabs for 3 days, 2 tabs for 3 days 1 tab for 5 days. Take with food.   escitalopram (LEXAPRO) 10 MG tablet Take 10 mg by mouth daily.   hydrochlorothiazide (HYDRODIURIL) 25 MG tablet Take 1 tablet Daily for BP & Fluid Retention / Ankle Swelling   SUMAtriptan (IMITREX) 100 MG tablet Take 1 tablet immediately for Migraine & may repeat 1 x  / in 2 hours   - Maximum 2 tablets / 24 hours   traZODone (DESYREL) 150 MG tablet TAKE 1 TABLET BY MOUTH EVERY NIGHT 1 HOUR BEFORE BEDTIME FOR SLEEP   No current facility-administered medications on file prior to visit.    ROS: all negative except above.   Physical Exam:  There were no vitals taken for this visit.  General Appearance: Well nourished, in no apparent distress. Eyes: PERRLA,  EOMs, conjunctiva no swelling or erythema Sinuses: No Frontal/maxillary tenderness ENT/Mouth: Ext aud canals clear, TMs without erythema, bulging. No erythema, swelling, or exudate on post pharynx.  Tonsils not swollen or erythematous. Hearing normal.  Neck: Supple, thyroid normal.  Respiratory: Respiratory effort normal, BS equal bilaterally without rales, rhonchi, wheezing or stridor.  Cardio: RRR with no MRGs. Brisk peripheral pulses without edema.  Abdomen: Soft, + BS.  Non tender, no guarding, rebound, hernias, masses. Lymphatics: Non tender without lymphadenopathy.  Musculoskeletal: Full ROM, 5/5 strength, normal gait.  Skin: Warm, dry without rashes, lesions, ecchymosis.  Neuro: Cranial nerves intact. Normal muscle tone, no cerebellar symptoms. Sensation intact.  Psych: Awake and oriented X 3, normal affect, Insight and Judgment appropriate.     Raynelle Dick, NP 12:19 PM [facility]

## 2022-04-24 ENCOUNTER — Ambulatory Visit (INDEPENDENT_AMBULATORY_CARE_PROVIDER_SITE_OTHER): Payer: BC Managed Care – PPO | Admitting: Nurse Practitioner

## 2022-04-24 ENCOUNTER — Encounter: Payer: Self-pay | Admitting: Nurse Practitioner

## 2022-04-24 VITALS — BP 114/70 | HR 84 | Temp 97.7°F | Ht 66.0 in | Wt 153.6 lb

## 2022-04-24 DIAGNOSIS — L710 Perioral dermatitis: Secondary | ICD-10-CM | POA: Diagnosis not present

## 2022-04-24 DIAGNOSIS — I10 Essential (primary) hypertension: Secondary | ICD-10-CM | POA: Diagnosis not present

## 2022-04-24 MED ORDER — DOXYCYCLINE HYCLATE 100 MG PO CAPS: ORAL_CAPSULE | ORAL | 0 refills | Status: DC

## 2022-04-29 ENCOUNTER — Other Ambulatory Visit: Payer: Self-pay | Admitting: Nurse Practitioner

## 2022-04-29 DIAGNOSIS — E782 Mixed hyperlipidemia: Secondary | ICD-10-CM

## 2022-05-01 ENCOUNTER — Other Ambulatory Visit: Payer: Self-pay | Admitting: Nurse Practitioner

## 2022-05-01 ENCOUNTER — Telehealth: Payer: Self-pay | Admitting: Nurse Practitioner

## 2022-05-01 DIAGNOSIS — L710 Perioral dermatitis: Secondary | ICD-10-CM

## 2022-05-01 NOTE — Telephone Encounter (Signed)
Pt is requesting a referral to dermatology for ongoing face rash  and to see Dr. Duard Larsen. Phone # [phone]

## 2022-05-01 NOTE — Telephone Encounter (Signed)
Computer was acting weird, this was made in error. Please disregard

## 2022-05-01 NOTE — Telephone Encounter (Signed)
Pt is wanting a referral to dermatology but requesting to specifically see Dr. Nita Sells

## 2022-05-13 DIAGNOSIS — L259 Unspecified contact dermatitis, unspecified cause: Secondary | ICD-10-CM | POA: Diagnosis not present

## 2022-05-13 DIAGNOSIS — L710 Perioral dermatitis: Secondary | ICD-10-CM | POA: Diagnosis not present

## 2022-07-17 ENCOUNTER — Other Ambulatory Visit: Payer: Self-pay | Admitting: Internal Medicine

## 2022-07-17 DIAGNOSIS — G441 Vascular headache, not elsewhere classified: Secondary | ICD-10-CM

## 2022-08-22 ENCOUNTER — Other Ambulatory Visit: Payer: Self-pay | Admitting: Nurse Practitioner

## 2022-08-22 DIAGNOSIS — F411 Generalized anxiety disorder: Secondary | ICD-10-CM

## 2022-08-22 MED ORDER — ALPRAZOLAM 0.5 MG PO TABS: ORAL_TABLET | ORAL | 0 refills | Status: DC

## 2022-08-22 NOTE — Progress Notes (Signed)
PDMP is reviewed and appropriate  

## 2022-09-24 ENCOUNTER — Other Ambulatory Visit: Payer: Self-pay | Admitting: Nurse Practitioner

## 2022-09-24 DIAGNOSIS — F411 Generalized anxiety disorder: Secondary | ICD-10-CM

## 2022-10-09 ENCOUNTER — Other Ambulatory Visit: Payer: Self-pay

## 2022-10-09 MED ORDER — TRAZODONE HCL 150 MG PO TABS: ORAL_TABLET | ORAL | 1 refills | Status: DC

## 2022-10-15 ENCOUNTER — Ambulatory Visit: Payer: BC Managed Care – PPO | Admitting: Nurse Practitioner

## 2022-10-15 VITALS — BP 118/80 | Temp 97.7°F | Ht 66.0 in | Wt 152.0 lb

## 2022-10-15 DIAGNOSIS — Z136 Encounter for screening for cardiovascular disorders: Secondary | ICD-10-CM | POA: Diagnosis not present

## 2022-10-15 DIAGNOSIS — Z6824 Body mass index (BMI) 24.0-24.9, adult: Secondary | ICD-10-CM

## 2022-10-15 DIAGNOSIS — I10 Essential (primary) hypertension: Secondary | ICD-10-CM

## 2022-10-15 DIAGNOSIS — Z0000 Encounter for general adult medical examination without abnormal findings: Secondary | ICD-10-CM

## 2022-10-15 DIAGNOSIS — R5383 Other fatigue: Secondary | ICD-10-CM

## 2022-10-15 DIAGNOSIS — Z130 Encounter for screening for diseases of the blood and blood-forming organs and certain disorders involving the immune mechanism: Secondary | ICD-10-CM

## 2022-10-15 DIAGNOSIS — Z23 Encounter for immunization: Secondary | ICD-10-CM | POA: Diagnosis not present

## 2022-10-15 DIAGNOSIS — G43709 Chronic migraine without aura, not intractable, without status migrainosus: Secondary | ICD-10-CM

## 2022-10-15 DIAGNOSIS — Z1329 Encounter for screening for other suspected endocrine disorder: Secondary | ICD-10-CM

## 2022-10-15 DIAGNOSIS — Z79899 Other long term (current) drug therapy: Secondary | ICD-10-CM

## 2022-10-15 DIAGNOSIS — Z0001 Encounter for general adult medical examination with abnormal findings: Secondary | ICD-10-CM

## 2022-10-15 DIAGNOSIS — F419 Anxiety disorder, unspecified: Secondary | ICD-10-CM

## 2022-10-15 DIAGNOSIS — Z13220 Encounter for screening for lipoid disorders: Secondary | ICD-10-CM

## 2022-10-15 DIAGNOSIS — M79644 Pain in right finger(s): Secondary | ICD-10-CM

## 2022-10-15 DIAGNOSIS — F338 Other recurrent depressive disorders: Secondary | ICD-10-CM

## 2022-10-15 DIAGNOSIS — E559 Vitamin D deficiency, unspecified: Secondary | ICD-10-CM

## 2022-10-15 DIAGNOSIS — F321 Major depressive disorder, single episode, moderate: Secondary | ICD-10-CM

## 2022-10-15 DIAGNOSIS — E538 Deficiency of other specified B group vitamins: Secondary | ICD-10-CM

## 2022-10-15 DIAGNOSIS — Z131 Encounter for screening for diabetes mellitus: Secondary | ICD-10-CM

## 2022-10-15 DIAGNOSIS — E782 Mixed hyperlipidemia: Secondary | ICD-10-CM

## 2022-10-15 DIAGNOSIS — Z1389 Encounter for screening for other disorder: Secondary | ICD-10-CM | POA: Diagnosis not present

## 2022-10-15 DIAGNOSIS — R7309 Other abnormal glucose: Secondary | ICD-10-CM

## 2022-10-15 NOTE — Progress Notes (Signed)
Complete Physical  Assessment and Plan:  Encounter for Annual Physical Exam with abnormal findings Due annually  Health Maintenance reviewed Healthy lifestyle reviewed and goals set  Hypertension Discussed DASH (Dietary Approaches to Stop Hypertension) DASH diet is lower in sodium than a typical American diet. Cut back on foods that are high in saturated fat, cholesterol, and trans fats. Eat more whole-grain foods, fish, poultry, and nuts Remain active and exercise as tolerated daily.  Monitor BP at home-Call if greater than 130/80.  Check CMP/CBC   Hyperlipidemia Discussed lifestyle modifications. Recommended diet heavy in fruits and veggies, omega 3's. Decrease consumption of animal meats, cheeses, and dairy products. Remain active and exercise as tolerated. Continue to monitor. Check lipids/TSH    Chronic migraine without aura  Continue Imitrex PRN  Discussed head CT if s/s fail to improve. Stay well hydrated Avoid triggers Continue to monitor  Abnormal glucose Education: Reviewed 'ABCs' of diabetes management  Discussed goals to be met and/or maintained include A1C (<7) Blood pressure (<130/80) Cholesterol (LDL <70) Continue Eye Exam yearly  Continue Dental Exam Q6 mo Discussed dietary recommendations Discussed Physical Activity recommendations Check A1C   Vitamin D Def Continue supplement Monitor levels  B12 Deficiency Continue supplement Monitor levels  MDD (HCC)/ Seasonal depression (HCC)/ insomnia Discussed Xanax PRN Continue Lexapro Stress management techniques discussed, increase water, good sleep hygiene discussed, increase exercise, and increase veggies.  She may benefit from therapy/counseling Discussed SE of  chronic use of benzo. Continue to monitor  BMI 24 Discussed appropriate BMI Goal of losing 1 lb per month. Diet modification. Physical activity. Encouraged/praised to build confidence.  Medication management All medications  discussed and reviewed in full. All questions and concerns regarding medications addressed.    Bilateral thumb pain Apply Voltaren gel Thumb splints at night.  Stay well hydrated Discussed further blood work for RA if s/s fail to improve.  Other fatigue Start low dose phentermine which will also help with weight gain. Stay well hydrated. Suggested returning to GYM/trainer is possible Continue to monitor  Orders Placed This Encounter  Procedures   MICROSCOPIC MESSAGE   Flu Vaccine QUAD 36+ mos IM (Fluarix, Fluzone & Afluria Quad PF   CBC with Differential/Platelet   COMPLETE METABOLIC PANEL WITH GFR   Lipid panel   TSH   Hemoglobin A1c   Insulin, random   VITAMIN D 25 Hydroxy (Vit-D Deficiency, Fractures)   Urinalysis, Routine w reflex microscopic   Microalbumin / creatinine urine ratio   Vitamin B12   Magnesium   EKG 12-Lead     Notify office for further evaluation and treatment, questions or concerns if any reported s/s fail to improve.   The patient was advised to call back or seek an in-person evaluation if any symptoms worsen or if the condition fails to improve as anticipated.   Further disposition pending results of labs. Discussed med's effects and SE's.    I discussed the assessment and treatment plan with the patient. The patient was provided an opportunity to ask questions and all were answered. The patient agreed with the plan and demonstrated an understanding of the instructions.  Discussed med's effects and SE's. Screening labs and tests as requested with regular follow-up as recommended.  I provided 40 minutes of face-to-face time during this encounter including counseling, chart review, and critical decision making was preformed.   Future Appointments  Date Time Provider Department Center  10/16/2023  2:00 PM Adela Glimpse, NP [facility] None     HPI  61  y.o. female  presents for a complete physical and follow up for has HSV-2 (herpes simplex virus  2) infection; Vitamin B 12 deficiency; Essential hypertension; Vitamin D deficiency; Elevated cholesterol; Abnormal glucose; Medication management; Insomnia; Chronic migraine without aura without status migrainosus, not intractable; Seasonal depression (HCC); and Current moderate episode of major depressive disorder without prior episode (HCC) on their problem list.  Divorced. She has boyfriend who recently had shoulder surgery.  She is having to care for him which has limited her availability to care for herself.  She is concerned about further weight gain.  She reports no energy, however stress has improved sine he moved out.  She is unable to return to the gym at this time.    She has 2 grown children.  She follows with [facility] for [facility], has upcoming appointment 01/2023. She is UTD on her screenings including mammogram, PAP.  Dexa discussed and due at age 63.  She has a diagnosis of anxiety/insomnia and is currently on xanax 0.5-1 mg, however, she has limited use of this now and is no longer taking night.  She does use trazodone 75 mg, which is effective.   She has migraines without aura, once improved, but now returned.  Most notably in the back of her head but sometimes in the nasal area.  Has Imitrex on hand for abortion which is effective.  She does not stay well hydrated, does not drink water, only tea, admits to eating hight salt foods such as charcuterie.    She endorses increase in bilateral thumb pain.  She is a Firefighter.  She once was referred for injection but did not follow through as they were unable to inject for unknown reason.    Has seen Dermatology for skin changes.  Continues to follow as needed  BMI is Body mass index is 24.53 kg/m., she has not been working on diet and exercise. She is frustrated with the gradual weight gain.   Wt Readings from Last 3 Encounters:  10/15/22 152 lb (68.9 kg)  04/24/22 153 lb 9.6 oz (69.7 kg)  04/01/22 148 lb (67.1 kg)   Her blood  pressure has been controlled at home, today their BP is BP: 118/80 She does not workout. She denies chest pain, shortness of breath, dizziness.   She is not on cholesterol medication (hx of rosuvastatin intolerance - sick to stomach and dizzy, prescribed zetia 10 mg daily but had aching) and denies myalgias. Her cholesterol is not at goal. The cholesterol last visit was:   Lab Results  Component Value Date   CHOL 187 04/01/2022   HDL 59 04/01/2022   LDLCALC 101 (H) 04/01/2022   TRIG 170 (H) 04/01/2022   CHOLHDL 3.2 04/01/2022   She has not been working on diet and exercise for glucose management, and denies polydipsia, polyuria, and visual disturbances. Last A1C in the office was:  Lab Results  Component Value Date   HGBA1C 4.9 04/01/2022   Last GFR: Lab Results  Component Value Date   GFRNONAA 74 09/12/2020   Patient is on Vitamin D supplement, has reduced from daily to a few days a week, unsure of exact dose.   Lab Results  Component Value Date   VD25OH 66 10/11/2021     She is newly on SL B12 daily, unsure of dose.  Lab Results  Component Value Date   VITAMINB12 876 10/11/2021     Current Medications:  Current Outpatient Medications on File Prior to Visit  Medication Sig  Dispense Refill   atenolol (TENORMIN) 100 MG tablet Take 1 tablet Daily for BP 90 tablet 3   escitalopram (LEXAPRO) 10 MG tablet Take 10 mg by mouth as needed.     ezetimibe (ZETIA) 10 MG tablet Take 10 mg by mouth daily.     hydrochlorothiazide (HYDRODIURIL) 25 MG tablet Take 1 tablet Daily for BP & Fluid Retention / Ankle Swelling 90 tablet 3   SUMAtriptan (IMITREX) 100 MG tablet TAKE 1 TABLET BY MOUTH IMMEDIATELY FOR MIGRAINE AND MAY REPEAT ONE TIME IN 2 HOURS. MAXIMUM 2 TABLETS IN 24 HOURS 30 tablet 3   traZODone (DESYREL) 150 MG tablet TAKE 1 TABLET BY MOUTH EVERY NIGHT 1 HOUR BEFORE BEDTIME FOR SLEEP 90 tablet 1   ALPRAZolam (XANAX) 0.5 MG tablet TAKE 1/2 TO 1 TABLET BY MOUTH 2 TO 3 TIMES DAILY AS  NEEDED FOR PANIC ATTACK OR ANXIETY. LIMIT TO 5 DAYS PER WEEK (Patient not taking: Reported on 10/15/2022) 40 tablet 0   doxycycline (VIBRAMYCIN) 100 MG capsule Take 1 capsule twice daily with food 42 capsule 0   No current facility-administered medications on file prior to visit.   Allergies:  Allergies  Allergen Reactions   Crestor [Rosuvastatin]    Penicillins Rash   Sulfa Antibiotics Rash   Medical History:  She has HSV-2 (herpes simplex virus 2) infection; Vitamin B 12 deficiency; Essential hypertension; Vitamin D deficiency; Elevated cholesterol; Abnormal glucose; Medication management; Insomnia; Chronic migraine without aura without status migrainosus, not intractable; Seasonal depression (HCC); and Current moderate episode of major depressive disorder without prior episode (HCC) on their problem list.  Health Maintenance:   Immunization History  Administered Date(s) Administered   Influenza Inj Mdck Quad With Preservative 07/27/2018, 08/10/2019, 09/12/2020   PFIZER(Purple Top)SARS-COV-2 Vaccination 12/21/2019, 01/11/2020   PPD Test 04/14/2014, 04/18/2015, 05/07/2016, 07/01/2017, 07/27/2018, 08/10/2019, 09/12/2020   Td 08/10/2019   Tdap 02/07/2009    Tetanus: 08/2019 Prevnar 20: get age 67 Flu vaccine: Due today  Shingrix: discussed get at pharmacy  Covid 19: 2/2, 2021 pfizer + booster -   Pap: 09/19/2020, Dewitt Hoes, 3 years Due 2024 MGM: Getting annually at GYN - UTD Due for f/u 4/24 DEXA: start age 49  Colonoscopy: declines at this time, declines cologuard  Last Dental Exam: Out of insurance, last in 2012 Last Eye Exam: 10/2021 yearly - [facility], [place]   Patient Care Team: Lucky Cowboy, MD as PCP - General (Internal Medicine)  Surgical History:  She has a past surgical history that includes ORIF ankle fracture (11/05/2012). Family History:  Herfamily history includes Aortic aneurysm in her mother; Brain cancer in her maternal grandfather; CAD in her  father; CAD (age of onset: 17) in her mother; COPD in her mother; Diabetes in her father; Heart disease in her maternal grandmother; Heart failure in her father; Hypertension in her father; Lung cancer (age of onset: 40) in her father. Social History:  She reports that she has never smoked. She has never used smokeless tobacco. She reports current alcohol use of about 3.0 standard drinks of alcohol per week. She reports that she does not use drugs.  Review of Systems: Review of Systems  Constitutional:  Negative for malaise/fatigue and weight loss.  HENT:  Negative for hearing loss and tinnitus.   Eyes:  Negative for blurred vision and double vision.  Respiratory:  Negative for cough, sputum production, shortness of breath and wheezing.   Cardiovascular:  Negative for chest pain, palpitations, orthopnea, claudication, leg swelling and PND.  Gastrointestinal:  Negative for abdominal pain, blood in stool, constipation, diarrhea, heartburn, melena, nausea and vomiting.  Genitourinary: Negative.   Musculoskeletal:  Negative for falls, joint pain and myalgias.  Skin:  Negative for rash.  Neurological:  Negative for dizziness, tingling, sensory change, weakness and headaches.  Endo/Heme/Allergies:  Negative for polydipsia.  Psychiatric/Behavioral:  Positive for depression (worse with winter season). Negative for memory loss, substance abuse and suicidal ideas. The patient is not nervous/anxious and does not have insomnia.   All other systems reviewed and are negative.   Physical Exam: Estimated body mass index is 24.53 kg/m as calculated from the following:   Height as of this encounter: 5\' 6"  (1.676 m).   Weight as of this encounter: 152 lb (68.9 kg). BP 118/80   Temp 97.7 F (36.5 C)   Ht 5\' 6"  (1.676 m)   Wt 152 lb (68.9 kg)   BMI 24.53 kg/m  General Appearance: Well nourished, in no apparent distress.  Eyes: PERRLA, EOMs, conjunctiva no swelling or erythema Sinuses: No  Frontal/maxillary tenderness  ENT/Mouth: Ext aud canals clear, normal light reflex with TMs without erythema, bulging. Good dentition. No erythema, swelling, or exudate on post pharynx. Tonsils not swollen or erythematous. Hearing normal.  Neck: Supple, thyroid normal. No bruits  Respiratory: Respiratory effort normal, BS equal bilaterally without rales, rhonchi, wheezing or stridor.  Cardio: RRR without murmurs, rubs or gallops. Brisk peripheral pulses without edema.  Chest: symmetric, with normal excursions and percussion.  Breasts: defer to GYN Abdomen: Soft, nontender, no guarding, rebound, hernias, masses, or organomegaly.  Lymphatics: Non tender without lymphadenopathy.  Genitourinary: defer to GYN Musculoskeletal: Full ROM all peripheral extremities,5/5 strength, and normal gait.  Skin: Warm, dry without rashes, lesions, ecchymosis. Neuro: Cranial nerves intact, reflexes equal bilaterally. Normal muscle tone, no cerebellar symptoms. Sensation intact.  Psych: Awake and oriented X 3, depressed affect, Insight and Judgment appropriate.   EKG: Sinus bradycardia  Darrol Jump, NP 2:04 PM [facility]

## 2022-10-15 NOTE — Patient Instructions (Signed)
Dehydration, Adult Dehydration is condition in which there is not enough water or other fluids in the body. This happens when a person loses more fluids than he or she takes in. Important body parts cannot work right without the right amount of fluids. Any loss of fluids from the body can cause dehydration. Dehydration can be mild, worse, or very bad. It should be treated right away to keep it from getting very bad. What are the causes? This condition may be caused by: Conditions that cause loss of water or other fluids, such as: Watery poop (diarrhea). Vomiting. Sweating a lot. Peeing (urinating) a lot. Not drinking enough fluids, especially when you: Are ill. Are doing things that take a lot of energy to do. Other illnesses and conditions, such as fever or infection. Certain medicines, such as medicines that take extra fluid out of the body (diuretics). Lack of safe drinking water. Not being able to get enough water and food. What increases the risk? The following factors may make you more likely to develop this condition: Having a long-term (chronic) illness that has not been treated the right way, such as: Diabetes. Heart disease. Kidney disease. Being 65 years of age or older. Having a disability. Living in a place that is high above the ground or sea (high in altitude). The thinner, dried air causes more fluid loss. Doing exercises that put stress on your body for a long time. What are the signs or symptoms? Symptoms of dehydration depend on how bad it is. Mild or worse dehydration Thirst. Dry lips or dry mouth. Feeling dizzy or light-headed, especially when you stand up from sitting. Muscle cramps. Your body making: Dark pee (urine). Pee may be the color of tea. Less pee than normal. Less tears than normal. Headache. Very bad dehydration Changes in skin. Skin may: Be cold to the touch (clammy). Be blotchy or pale. Not go back to normal right after you lightly pinch  it and let it go. Little or no tears, pee, or sweat. Changes in vital signs, such as: Fast breathing. Low blood pressure. Weak pulse. Pulse that is more than 100 beats a minute when you are sitting still. Other changes, such as: Feeling very thirsty. Eyes that look hollow (sunken). Cold hands and feet. Being mixed up (confused). Being very tired (lethargic) or having trouble waking from sleep. Short-term weight loss. Loss of consciousness. How is this treated? Treatment for this condition depends on how bad it is. Treatment should start right away. Do not wait until your condition gets very bad. Very bad dehydration is an emergency. You will need to go to a hospital. Mild or worse dehydration can be treated at home. You may be asked to: Drink more fluids. Drink an oral rehydration solution (ORS). This drink helps get the right amounts of fluids and salts and minerals in the blood (electrolytes). Very bad dehydration can be treated: With fluids through an IV tube. By getting normal levels of salts and minerals in your blood. This is often done by giving salts and minerals through a tube. The tube is passed through your nose and into your stomach. By treating the root cause. Follow these instructions at home: Oral rehydration solution If told by your doctor, drink an ORS: Make an ORS. Use instructions on the package. Start by drinking small amounts, about  cup (120 mL) every 5-10 minutes. Slowly drink more until you have had the amount that your doctor said to have. Eating and drinking          Drink enough clear fluid to keep your pee pale yellow. If you were told to drink an ORS, finish the ORS first. Then, start slowly drinking other clear fluids. Drink fluids such as: Water. Do not drink only water. Doing that can make the salt (sodium) level in your body get too low. Water from ice chips you suck on. Fruit juice that you have added water to (diluted). Low-calorie sports  drinks. Eat foods that have the right amounts of salts and minerals, such as: Bananas. Oranges. Potatoes. Tomatoes. Spinach. Do not drink alcohol. Avoid: Drinks that have a lot of sugar. These include: High-calorie sports drinks. Fruit juice that you did not add water to. Soda. Caffeine. Foods that are greasy or have a lot of fat or sugar. General instructions Take over-the-counter and prescription medicines only as told by your doctor. Do not take salt tablets. Doing that can make the salt level in your body get too high. Return to your normal activities as told by your doctor. Ask your doctor what activities are safe for you. Keep all follow-up visits as told by your doctor. This is important. Contact a doctor if: You have pain in your belly (abdomen) and the pain: Gets worse. Stays in one place. You have a rash. You have a stiff neck. You get angry or annoyed (irritable) more easily than normal. You are more tired or have a harder time waking than normal. You feel: Weak or dizzy. Very thirsty. Get help right away if you have: Any symptoms of very bad dehydration. Symptoms of vomiting, such as: You cannot eat or drink without vomiting. Your vomiting gets worse or does not go away. Your vomit has blood or green stuff in it. Symptoms that get worse with treatment. A fever. A very bad headache. Problems with peeing or pooping (having a bowel movement), such as: Watery poop that gets worse or does not go away. Blood in your poop (stool). This may cause poop to look black and tarry. Not peeing in 6-8 hours. Peeing only a small amount of very dark pee in 6-8 hours. Trouble breathing. These symptoms may be an emergency. Do not wait to see if the symptoms will go away. Get medical help right away. Call your local emergency services (911 in the [place] Do not drive yourself to the hospital. Summary Dehydration is a condition in which there is not enough water or other fluids  in the body. This happens when a person loses more fluids than he or she takes in. Treatment for this condition depends on how bad it is. Treatment should be started right away. Do not wait until your condition gets very bad. Drink enough clear fluid to keep your pee pale yellow. If you were told to drink an oral rehydration solution (ORS), finish the ORS first. Then, start slowly drinking other clear fluids. Take over-the-counter and prescription medicines only as told by your doctor. Get help right away if you have any symptoms of very bad dehydration. This information is not intended to replace advice given to you by your health care provider. Make sure you discuss any questions you have with your health care provider. Document Revised: 01/30/2022 Document Reviewed: 05/06/2019 Elsevier Patient Education  2023 [employer]  

## 2022-10-16 LAB — CBC WITH DIFFERENTIAL/PLATELET
Absolute Monocytes: 409 cells/uL (ref 200–950)
Basophils Absolute: 22 cells/uL (ref 0–200)
Basophils Relative: 0.4 %
Eosinophils Absolute: 101 cells/uL (ref 15–500)
Eosinophils Relative: 1.8 %
HCT: 42.9 % (ref 35.0–45.0)
Hemoglobin: 14.8 g/dL (ref 11.7–15.5)
Lymphs Abs: 1378 cells/uL (ref 850–3900)
MCH: 30.4 pg (ref 27.0–33.0)
MCHC: 34.5 g/dL (ref 32.0–36.0)
MCV: 88.1 fL (ref 80.0–100.0)
MPV: 10.9 fL (ref 7.5–12.5)
Monocytes Relative: 7.3 %
Neutro Abs: 3690 cells/uL (ref 1500–7800)
Neutrophils Relative %: 65.9 %
Platelets: 244 10*3/uL (ref 140–400)
RBC: 4.87 10*6/uL (ref 3.80–5.10)
RDW: 12.6 % (ref 11.0–15.0)
Total Lymphocyte: 24.6 %
WBC: 5.6 10*3/uL (ref 3.8–10.8)

## 2022-10-16 LAB — URINALYSIS, ROUTINE W REFLEX MICROSCOPIC
Bacteria, UA: NONE SEEN /HPF
Bilirubin Urine: NEGATIVE
Glucose, UA: NEGATIVE
Hgb urine dipstick: NEGATIVE
Ketones, ur: NEGATIVE
Nitrite: NEGATIVE
Protein, ur: NEGATIVE
RBC / HPF: NONE SEEN /HPF (ref 0–2)
Specific Gravity, Urine: 1.012 (ref 1.001–1.035)
pH: 6.5 (ref 5.0–8.0)

## 2022-10-16 LAB — MICROALBUMIN / CREATININE URINE RATIO
Creatinine, Urine: 52 mg/dL (ref 20–275)
Microalb Creat Ratio: 10 mcg/mg creat (ref <30)
Microalb, Ur: 0.5 mg/dL

## 2022-10-16 LAB — COMPLETE METABOLIC PANEL WITH GFR
AG Ratio: 2.6 (calc) — ABNORMAL HIGH (ref 1.0–2.5)
ALT: 18 U/L (ref 6–29)
AST: 25 U/L (ref 10–35)
Albumin: 4.6 g/dL (ref 3.6–5.1)
Alkaline phosphatase (APISO): 70 U/L (ref 37–153)
BUN: 12 mg/dL (ref 7–25)
CO2: 31 mmol/L (ref 20–32)
Calcium: 9.8 mg/dL (ref 8.6–10.4)
Chloride: 102 mmol/L (ref 98–110)
Creat: 0.75 mg/dL (ref 0.50–1.05)
Globulin: 1.8 g/dL (calc) — ABNORMAL LOW (ref 1.9–3.7)
Glucose, Bld: 84 mg/dL (ref 65–99)
Potassium: 4.1 mmol/L (ref 3.5–5.3)
Sodium: 141 mmol/L (ref 135–146)
Total Bilirubin: 0.6 mg/dL (ref 0.2–1.2)
Total Protein: 6.4 g/dL (ref 6.1–8.1)
eGFR: 89 mL/min/{1.73_m2} (ref >=60)

## 2022-10-16 LAB — LIPID PANEL
Cholesterol: 190 mg/dL (ref <200)
HDL: 65 mg/dL (ref >=50)
LDL Cholesterol (Calc): 100 mg/dL (calc) — ABNORMAL HIGH
Non-HDL Cholesterol (Calc): 125 mg/dL (calc) (ref <130)
Total CHOL/HDL Ratio: 2.9 (calc) (ref <5.0)
Triglycerides: 153 mg/dL — ABNORMAL HIGH (ref <150)

## 2022-10-16 LAB — VITAMIN D 25 HYDROXY (VIT D DEFICIENCY, FRACTURES): Vit D, 25-Hydroxy: 43 ng/mL (ref 30–100)

## 2022-10-16 LAB — HEMOGLOBIN A1C
Hgb A1c MFr Bld: 5.1 % of total Hgb (ref <5.7)
Mean Plasma Glucose: 100 mg/dL
eAG (mmol/L): 5.5 mmol/L

## 2022-10-16 LAB — INSULIN, RANDOM: Insulin: 4.7 u[IU]/mL

## 2022-10-16 LAB — MICROSCOPIC MESSAGE

## 2022-10-16 LAB — MAGNESIUM: Magnesium: 2 mg/dL (ref 1.5–2.5)

## 2022-10-16 LAB — VITAMIN B12: Vitamin B-12: 392 pg/mL (ref 200–1100)

## 2022-10-16 LAB — TSH: TSH: 1.36 mIU/L (ref 0.40–4.50)

## 2022-10-20 MED ORDER — PHENTERMINE HCL 37.5 MG PO TABS: ORAL_TABLET | ORAL | 0 refills | Status: DC

## 2022-11-04 DIAGNOSIS — R3915 Urgency of urination: Secondary | ICD-10-CM | POA: Diagnosis not present

## 2022-11-04 DIAGNOSIS — N952 Postmenopausal atrophic vaginitis: Secondary | ICD-10-CM | POA: Diagnosis not present

## 2022-11-04 DIAGNOSIS — N898 Other specified noninflammatory disorders of vagina: Secondary | ICD-10-CM | POA: Diagnosis not present

## 2023-01-08 DIAGNOSIS — Z1389 Encounter for screening for other disorder: Secondary | ICD-10-CM | POA: Diagnosis not present

## 2023-01-08 DIAGNOSIS — Z01419 Encounter for gynecological examination (general) (routine) without abnormal findings: Secondary | ICD-10-CM | POA: Diagnosis not present

## 2023-01-08 DIAGNOSIS — Z1231 Encounter for screening mammogram for malignant neoplasm of breast: Secondary | ICD-10-CM | POA: Diagnosis not present

## 2023-01-08 DIAGNOSIS — N952 Postmenopausal atrophic vaginitis: Secondary | ICD-10-CM | POA: Diagnosis not present

## 2023-01-10 ENCOUNTER — Other Ambulatory Visit: Payer: Self-pay | Admitting: Nurse Practitioner

## 2023-03-13 ENCOUNTER — Other Ambulatory Visit: Payer: Self-pay | Admitting: Nurse Practitioner

## 2023-04-23 ENCOUNTER — Ambulatory Visit: Payer: BC Managed Care – PPO | Admitting: Nurse Practitioner

## 2023-04-30 ENCOUNTER — Ambulatory Visit: Payer: BC Managed Care – PPO | Admitting: Nurse Practitioner

## 2023-05-09 ENCOUNTER — Encounter: Payer: Self-pay | Admitting: Nurse Practitioner

## 2023-05-09 ENCOUNTER — Ambulatory Visit: Payer: BC Managed Care – PPO | Admitting: Nurse Practitioner

## 2023-05-09 VITALS — BP 128/80 | HR 78 | Temp 97.8°F | Ht 66.0 in | Wt 137.2 lb

## 2023-05-09 DIAGNOSIS — G43709 Chronic migraine without aura, not intractable, without status migrainosus: Secondary | ICD-10-CM

## 2023-05-09 DIAGNOSIS — I10 Essential (primary) hypertension: Secondary | ICD-10-CM | POA: Diagnosis not present

## 2023-05-09 DIAGNOSIS — Z79899 Other long term (current) drug therapy: Secondary | ICD-10-CM

## 2023-05-09 DIAGNOSIS — F419 Anxiety disorder, unspecified: Secondary | ICD-10-CM

## 2023-05-09 DIAGNOSIS — F338 Other recurrent depressive disorders: Secondary | ICD-10-CM

## 2023-05-09 DIAGNOSIS — Z6824 Body mass index (BMI) 24.0-24.9, adult: Secondary | ICD-10-CM

## 2023-05-09 DIAGNOSIS — E782 Mixed hyperlipidemia: Secondary | ICD-10-CM

## 2023-05-09 DIAGNOSIS — R7309 Other abnormal glucose: Secondary | ICD-10-CM

## 2023-05-09 DIAGNOSIS — R5383 Other fatigue: Secondary | ICD-10-CM

## 2023-05-09 DIAGNOSIS — F5105 Insomnia due to other mental disorder: Secondary | ICD-10-CM

## 2023-05-09 DIAGNOSIS — E559 Vitamin D deficiency, unspecified: Secondary | ICD-10-CM

## 2023-05-09 DIAGNOSIS — E538 Deficiency of other specified B group vitamins: Secondary | ICD-10-CM

## 2023-05-09 MED ORDER — EZETIMIBE 10 MG PO TABS: 10.0000 mg | ORAL_TABLET | Freq: Every day | ORAL | 0 refills | Status: DC

## 2023-05-09 NOTE — Progress Notes (Unsigned)
Follow Up  Assessment and Plan:  Hypertension Discussed DASH (Dietary Approaches to Stop Hypertension) DASH diet is lower in sodium than a typical American diet. Cut back on foods that are high in saturated fat, cholesterol, and trans fats. Eat more whole-grain foods, fish, poultry, and nuts Remain active and exercise as tolerated daily.  Monitor BP at home-Call if greater than 130/80.  Check CMP/CBC  Hyperlipidemia Discussed lifestyle modifications. Recommended diet heavy in fruits and veggies, omega 3's. Decrease consumption of animal meats, cheeses, and dairy products. Remain active and exercise as tolerated. Continue to monitor. Check lipids/TSH    Chronic migraine without aura  Continue Imitrex PRN  Discussed head CT if s/s fail to improve. Stay well hydrated Avoid triggers Continue to monitor  Abnormal glucose Education: Reviewed 'ABCs' of diabetes management  Discussed goals to be met and/or maintained include A1C (<7) Blood pressure (<130/80) Cholesterol (LDL <70) Continue Eye Exam yearly  Continue Dental Exam Q6 mo Discussed dietary recommendations Discussed Physical Activity recommendations Check A1C   Vitamin D Def Continue supplement Monitor levels  B12 Deficiency Continue supplement Monitor levels  Seasonal depression (HCC)/ insomnia Discussed Xanax PRN Continue Lexapro Stress management techniques discussed, increase water, good sleep hygiene discussed, increase exercise, and increase veggies.  She may benefit from therapy/counseling Discussed SE of  chronic use of benzo. Continue to monitor  BMI 24 Discussed appropriate BMI Goal of losing 1 lb per month. Diet modification. Physical activity. Encouraged/praised to build confidence.  Medication management All medications discussed and reviewed in full. All questions and concerns regarding medications addressed.    Other fatigue Start low dose phentermine which will also help with  weight gain. Stay well hydrated. Suggested returning to GYM/trainer is possible Continue to monitor     Notify office for further evaluation and treatment, questions or concerns if any reported s/s fail to improve.   The patient was advised to call back or seek an in-person evaluation if any symptoms worsen or if the condition fails to improve as anticipated.   Further disposition pending results of labs. Discussed med's effects and SE's.    I discussed the assessment and treatment plan with the patient. The patient was provided an opportunity to ask questions and all were answered. The patient agreed with the plan and demonstrated an understanding of the instructions.  Discussed med's effects and SE's. Screening labs and tests as requested with regular follow-up as recommended.  I provided 25 minutes of face-to-face time during this encounter including counseling, chart review, and critical decision making was preformed.   Future Appointments  Date Time Provider Department Center  05/09/2023 11:00 AM Adela Glimpse, NP [facility] None  10/16/2023  2:00 PM Alda Gaultney, Archie Patten, NP [facility] None     HPI  62 y.o. female  presents for a complete physical and follow up for has HSV-2 (herpes simplex virus 2) infection; Vitamin B 12 deficiency; Essential hypertension; Vitamin D deficiency; Elevated cholesterol; Abnormal glucose; Medication management; Insomnia; Chronic migraine without aura without status migrainosus, not intractable; Seasonal depression (HCC); and Current moderate episode of major depressive disorder without prior episode (HCC) on their problem list.  Overall she reports feeling well today.  Continuing to live with boyfriend, recently well healed from shoulder surgery.  She follows with [facility] for PAP, has upcoming appointment 01/2023. She is UTD on her screenings including mammogram, PAP.  Dexa discussed and due at age 89.  She has a diagnosis of anxiety/insomnia  and is currently on xanax 0.5-1 mg, however,  she has limited use of this now and is no longer taking night.  She does use trazodone 75 mg, which is effective.   She has migraines without aura, once improved, but now returned.  Most notably in the back of her head but sometimes in the nasal area.  Has Imitrex on hand for abortion which is effective.  She does not stay well hydrated, does not drink water, only tea, admits to eating hight salt foods such as charcuterie.    She endorses increase in bilateral thumb pain.  She is a Firefighter.  She once was referred for injection but did not follow through as they were unable to inject for unknown reason.    BMI is There is no height or weight on file to calculate BMI., she has not been working on diet and exercise. She is frustrated with the gradual weight gain.   Wt Readings from Last 3 Encounters:  10/15/22 152 lb (68.9 kg)  04/24/22 153 lb 9.6 oz (69.7 kg)  04/01/22 148 lb (67.1 kg)   Her blood pressure has been controlled at home, today their BP is   She does not workout. She denies chest pain, shortness of breath, dizziness.   She is not on cholesterol medication (hx of rosuvastatin intolerance - sick to stomach and dizzy, prescribed zetia 10 mg daily but had aching) and denies myalgias. Her cholesterol is not at goal. The cholesterol last visit was:   Lab Results  Component Value Date   CHOL 190 10/15/2022   HDL 65 10/15/2022   LDLCALC 100 (H) 10/15/2022   TRIG 153 (H) 10/15/2022   CHOLHDL 2.9 10/15/2022   She has not been working on diet and exercise for glucose management, and denies polydipsia, polyuria, and visual disturbances. Last A1C in the office was:  Lab Results  Component Value Date   HGBA1C 5.1 10/15/2022   Last GFR: Lab Results  Component Value Date   GFRNONAA 74 09/12/2020   Patient is on Vitamin D supplement, has reduced from daily to a few days a week, unsure of exact dose.   Lab Results  Component Value Date    VD25OH 6 10/15/2022     She is newly on SL B12 daily, unsure of dose.  Lab Results  Component Value Date   VITAMINB12 392 10/15/2022     Current Medications:  Current Outpatient Medications on File Prior to Visit  Medication Sig Dispense Refill   ALPRAZolam (XANAX) 0.5 MG tablet TAKE 1/2 TO 1 TABLET BY MOUTH 2 TO 3 TIMES DAILY AS NEEDED FOR PANIC ATTACK OR ANXIETY. LIMIT TO 5 DAYS PER WEEK (Patient not taking: Reported on 10/15/2022) 40 tablet 0   atenolol (TENORMIN) 100 MG tablet Take 1 tablet Daily for BP 90 tablet 3   doxycycline (VIBRAMYCIN) 100 MG capsule Take 1 capsule twice daily with food 42 capsule 0   escitalopram (LEXAPRO) 10 MG tablet Take 10 mg by mouth as needed.     ezetimibe (ZETIA) 10 MG tablet Take 10 mg by mouth daily.     hydrochlorothiazide (HYDRODIURIL) 25 MG tablet Take 1 tablet Daily for BP & Fluid Retention / Ankle Swelling 90 tablet 3   phentermine (ADIPEX-P) 37.5 MG tablet TAKE 1/2 TO 1 TABLET BY MOUTH EVERY MORNING FOR DIETING AND WEIGHT LOSS 30 tablet 0   SUMAtriptan (IMITREX) 100 MG tablet TAKE 1 TABLET BY MOUTH IMMEDIATELY FOR MIGRAINE AND MAY REPEAT ONE TIME IN 2 HOURS. MAXIMUM 2 TABLETS IN 24 HOURS 30 tablet  3   traZODone (DESYREL) 150 MG tablet TAKE 1 TABLET BY MOUTH EVERY NIGHT 1 HOUR BEFORE BEDTIME FOR SLEEP 90 tablet 1   No current facility-administered medications on file prior to visit.   Allergies:  Allergies  Allergen Reactions   Crestor [Rosuvastatin]    Penicillins Rash   Sulfa Antibiotics Rash   Medical History:  She has HSV-2 (herpes simplex virus 2) infection; Vitamin B 12 deficiency; Essential hypertension; Vitamin D deficiency; Elevated cholesterol; Abnormal glucose; Medication management; Insomnia; Chronic migraine without aura without status migrainosus, not intractable; Seasonal depression (HCC); and Current moderate episode of major depressive disorder without prior episode (HCC) on their problem list.  Health Maintenance:    Immunization History  Administered Date(s) Administered   Influenza Inj Mdck Quad With Preservative 07/27/2018, 08/10/2019, 09/12/2020   Influenza,inj,Quad PF,6+ Mos 10/15/2022   PFIZER(Purple Top)SARS-COV-2 Vaccination 12/21/2019, 01/11/2020   PPD Test 04/14/2014, 04/18/2015, 05/07/2016, 07/01/2017, 07/27/2018, 08/10/2019, 09/12/2020   Td 08/10/2019   Tdap 02/07/2009    Patient Care Team: Lucky Cowboy, MD as PCP - General (Internal Medicine)  Surgical History:  She has a past surgical history that includes ORIF ankle fracture (11/05/2012). Family History:  Herfamily history includes Aortic aneurysm in her mother; Brain cancer in her maternal grandfather; CAD in her father; CAD (age of onset: 23) in her mother; COPD in her mother; Diabetes in her father; Heart disease in her maternal grandmother; Heart failure in her father; Hypertension in her father; Lung cancer (age of onset: 51) in her father. Social History:  She reports that she has never smoked. She has never used smokeless tobacco. She reports current alcohol use of about 3.0 standard drinks of alcohol per week. She reports that she does not use drugs.  Review of Systems: Review of Systems  Constitutional:  Negative for malaise/fatigue and weight loss.  HENT:  Negative for hearing loss and tinnitus.   Eyes:  Negative for blurred vision and double vision.  Respiratory:  Negative for cough, sputum production, shortness of breath and wheezing.   Cardiovascular:  Negative for chest pain, palpitations, orthopnea, claudication, leg swelling and PND.  Gastrointestinal:  Negative for abdominal pain, blood in stool, constipation, diarrhea, heartburn, melena, nausea and vomiting.  Genitourinary: Negative.   Musculoskeletal:  Negative for falls, joint pain and myalgias.  Skin:  Negative for rash.  Neurological:  Negative for dizziness, tingling, sensory change, weakness and headaches.  Endo/Heme/Allergies:  Negative for polydipsia.   Psychiatric/Behavioral:  Positive for depression (worse with winter season). Negative for memory loss, substance abuse and suicidal ideas. The patient is not nervous/anxious and does not have insomnia.   All other systems reviewed and are negative.   Physical Exam: Estimated body mass index is 24.53 kg/m as calculated from the following:   Height as of 10/15/22: 5\' 6"  (1.676 m).   Weight as of 10/15/22: 152 lb (68.9 kg). There were no vitals taken for this visit. General Appearance: Well nourished, in no apparent distress.  Eyes: PERRLA, EOMs, conjunctiva no swelling or erythema Sinuses: No Frontal/maxillary tenderness  ENT/Mouth: Ext aud canals clear, normal light reflex with TMs without erythema, bulging. Good dentition. No erythema, swelling, or exudate on post pharynx. Tonsils not swollen or erythematous. Hearing normal.  Neck: Supple, thyroid normal. No bruits  Respiratory: Respiratory effort normal, BS equal bilaterally without rales, rhonchi, wheezing or stridor.  Cardio: RRR without murmurs, rubs or gallops. Brisk peripheral pulses without edema.  Chest: symmetric, with normal excursions and percussion.  Breasts: defer to GYN  Abdomen: Soft, nontender, no guarding, rebound, hernias, masses, or organomegaly.  Lymphatics: Non tender without lymphadenopathy.  Genitourinary: defer to GYN Musculoskeletal: Full ROM all peripheral extremities,5/5 strength, and normal gait.  Skin: Warm, dry without rashes, lesions, ecchymosis. Neuro: Cranial nerves intact, reflexes equal bilaterally. Normal muscle tone, no cerebellar symptoms. Sensation intact.  Psych: Awake and oriented X 3, depressed affect, Insight and Judgment appropriate.   Adela Glimpse, NP 9:17 AM [facility]

## 2023-05-11 NOTE — Patient Instructions (Signed)

## 2023-06-02 ENCOUNTER — Other Ambulatory Visit: Payer: Self-pay | Admitting: Nurse Practitioner

## 2023-08-08 ENCOUNTER — Other Ambulatory Visit: Payer: Self-pay | Admitting: Nurse Practitioner

## 2023-08-08 DIAGNOSIS — G441 Vascular headache, not elsewhere classified: Secondary | ICD-10-CM

## 2023-10-15 NOTE — Progress Notes (Signed)
 Complete Physical  Assessment and Plan:  Encounter for Annual Physical Exam with abnormal findings Due annually  Health Maintenance reviewed Healthy lifestyle reviewed and goals set  Hypertension Discussed DASH (Dietary Approaches to Stop Hypertension) DASH diet is lower in sodium than a typical American diet. Cut back on foods that are high in saturated fat, cholesterol, and trans fats. Eat more whole-grain foods, fish, poultry, and nuts Remain active and exercise as tolerated daily.  Monitor BP at home-Call if greater than 130/80.  Check CMP/CBC  Hyperlipidemia Discussed lifestyle modifications. Recommended diet heavy in fruits and veggies, omega 3's. Decrease consumption of animal meats, cheeses, and dairy products. Remain active and exercise as tolerated. Continue to monitor. Check lipids/TSH    Chronic migraine without aura  Discussed starting Beta Blocker for prevention - patient defers  And requests to take antihistamine (zyrtec 10 mg samples provided) x1-2 weeks to assess HA trigger. Continue Imitrex  PRN  Discussed head CT if s/s fail to improve. Stay well hydrated Avoid triggers Continue to monitor  Abnormal glucose Education: Reviewed 'ABCs' of diabetes management  Discussed goals to be met and/or maintained include A1C (<7) Blood pressure (<130/80) Cholesterol (LDL <70) Continue Eye Exam yearly  Continue Dental Exam Q6 mo Discussed dietary recommendations Discussed Physical Activity recommendations Check A1C   Vitamin D  Def Continue supplement Monitor levels  B12 Deficiency Continue supplement Monitor levels  MDD (HCC)/ Seasonal depression (HCC)/ insomnia Continue Lexapro Stress management techniques discussed, increase water, good sleep hygiene discussed, increase exercise, and increase veggies.  She may benefit from therapy/counseling Discussed SE of  chronic use of benzo. Continue to monitor  BMI 24 Discussed appropriate BMI Diet  modification. Physical activity. Encouraged/praised to build confidence.  Medication management All medications discussed and reviewed in full. All questions and concerns regarding medications addressed.    Bilateral hand/left thumb pain Refer to Orthopedics for further review and evaluation.  Thumb splints at night.  Stay well hydrated  Other fatigue Continue phentermine  which continues to help with with weight gain. Stay well hydrated. Suggested returning to GYM/trainer if possible Continue to monitor  Screening for DM Monitor A1c and insulin   Screening for hematuria or proteinuria Check and monitor Microalbumin / creatinine urine ratio Check and monitor Urinalysis, Routine w reflex microscopic  Vaginal Odor UTI vs bacterial vaginosis Suggested OTC probiotic/yogurt UA with Cx - Prophylactic tmt with ciprofloxacin  May start Flagyl  if BV symptoms present including fishy odor with milky white discharge.  Continue to follow with Dr. Estelle    Orders Placed This Encounter  Procedures   Urine Culture   CBC with Differential/Platelet   COMPLETE METABOLIC PANEL WITH GFR   Magnesium   Lipid panel   TSH   Hemoglobin A1c   Insulin , random   VITAMIN D  25 Hydroxy (Vit-D Deficiency, Fractures)   Urinalysis, Routine w reflex microscopic   Microalbumin / creatinine urine ratio   Ambulatory referral to Orthopedic Surgery    Referral Priority:   Routine    Referral Type:   Surgical    Referral Reason:   Specialty Services Required    Requested Specialty:   Orthopedic Surgery    Number of Visits Requested:   1   EKG 12-Lead    Notify office for further evaluation and treatment, questions or concerns if any reported s/s fail to improve.   The patient was advised to call back or seek an in-person evaluation if any symptoms worsen or if the condition fails to improve as anticipated.   Further  disposition pending results of labs. Discussed med's effects and SE's.    I  discussed the assessment and treatment plan with the patient. The patient was provided an opportunity to ask questions and all were answered. The patient agreed with the plan and demonstrated an understanding of the instructions.  Discussed med's effects and SE's. Screening labs and tests as requested with regular follow-up as recommended.  I provided 40 minutes of face-to-face time during this encounter including counseling, chart review, and critical decision making was preformed.   Future Appointments  Date Time Provider Department Center  10/20/2024  2:00 PM Laurice President, NP [facility] None     HPI  63 y.o. female  presents for a complete physical and follow up for has HSV-2 (herpes simplex virus 2) infection; Vitamin B 12 deficiency; Essential hypertension; Vitamin D  deficiency; Elevated cholesterol; Abnormal glucose; Medication management; Insomnia; Chronic migraine without aura without status migrainosus, not intractable; Seasonal depression (HCC); and Current moderate episode of major depressive disorder without prior episode (HCC) on their problem list.  Divorced. She had boyfriend who had had shoulder surgery.  She was having to care for him which has limited her availability to care for herself.  Stress has improved sine he moved out.    She has 2 grown children.  She follows with [facility] for PAP, 01/2023. She is UTD on her screenings including mammogram, PAP.  Dexa discussed and due at age 34.  Endorses a malodor in the vaginal area.  Feels she was treated for BV but may have returned.  Has increase in possible leakage from urethra/stress incontinence with irritation. Was told she had uterine prolapse.    She has a diagnosis of anxiety/insomnia and is treated with  Lexapro.  Also on trazodone  75 mg, which is effective.   She has migraines without aura, once improved, but now returned.  Felt to be more prevalent after age 62.  Almost daily.  Not on a preventative.  Feels  it is triggered by allergies.  She does  not take a daily antihistamine.  Most notably in the back of her head but sometimes in the nasal area.  Has Imitrex  on hand for abortion which is effective, takes sparingly.  She does not stay well hydrated, does not drink water, only tea, admits to eating hight salt foods such as charcuterie.    Continues to have bilateral thumb pain; L>R. She is a firefighter.  She once was referred for injection but did not follow through as they were unable to inject for unknown reason.  She is receptive to steroid injection.   Has seen Dermatology for skin changes.  Continues to follow as needed.  No new concerns.   BMI is Body mass index is 22.53 kg/m., she has been working on diet and exercise. Phentermine  effective at keep weight well controlled.  Wt Readings from Last 3 Encounters:  10/16/23 139 lb 9.6 oz (63.3 kg)  05/09/23 137 lb 3.2 oz (62.2 kg)  10/15/22 152 lb (68.9 kg)   Her blood pressure has been controlled at home, today their BP is BP: 122/82 She does not workout. She denies chest pain, shortness of breath, dizziness.   She is not on cholesterol medication (hx of rosuvastatin intolerance - sick to stomach and dizzy, prescribed zetia  10 mg daily but had aching) and denies myalgias. Her cholesterol is not at goal. The cholesterol last visit was:   Lab Results  Component Value Date   CHOL 190 10/15/2022   HDL  65 10/15/2022   LDLCALC 100 (H) 10/15/2022   TRIG 153 (H) 10/15/2022   CHOLHDL 2.9 10/15/2022   She has not been working on diet and exercise for glucose management, and denies polydipsia, polyuria, and visual disturbances. Last A1C in the office was:  Lab Results  Component Value Date   HGBA1C 5.1 10/15/2022   Last GFR: Lab Results  Component Value Date   GFRNONAA 74 09/12/2020   Patient is on Vitamin D  supplement, has reduced from daily to a few days a week, unsure of exact dose.   Lab Results  Component Value Date   VD25OH 33 10/15/2022      She is newly on SL B12 daily, unsure of dose.  Lab Results  Component Value Date   VITAMINB12 392 10/15/2022     Current Medications:  Current Outpatient Medications on File Prior to Visit  Medication Sig Dispense Refill   atenolol  (TENORMIN ) 100 MG tablet Take 1 tablet Daily for BP 90 tablet 3   escitalopram (LEXAPRO) 10 MG tablet Take 10 mg by mouth as needed.     ezetimibe  (ZETIA ) 10 MG tablet Take 1 tablet (10 mg total) by mouth daily. 90 tablet 0   hydrochlorothiazide  (HYDRODIURIL ) 25 MG tablet Take 1 tablet Daily for BP & Fluid Retention / Ankle Swelling 90 tablet 3   phentermine  (ADIPEX-P ) 37.5 MG tablet TAKE 1/2 TO 1 TABLET BY MOUTH EVERY MORNING FOR DIETING AND WEIGHT LOSS 30 tablet 0   SUMAtriptan  (IMITREX ) 100 MG tablet TAKE 1 TABLET BY MOUTH IMMEDIATELY FOR MIGRAINE AND MAY REPEAT ONE TIME IN 2 HOURS. MAXIMUM DAILY DOSE IS 2 TABLETS 30 tablet 3   traZODone  (DESYREL ) 150 MG tablet TAKE 1 TABLET BY MOUTH EVERY NIGHT 1 HOUR BEFORE BEDTIME FOR SLEEP 90 tablet 1   No current facility-administered medications on file prior to visit.   Allergies:  Allergies  Allergen Reactions   Crestor [Rosuvastatin]    Penicillins Rash   Sulfa Antibiotics Rash   Medical History:  She has HSV-2 (herpes simplex virus 2) infection; Vitamin B 12 deficiency; Essential hypertension; Vitamin D  deficiency; Elevated cholesterol; Abnormal glucose; Medication management; Insomnia; Chronic migraine without aura without status migrainosus, not intractable; Seasonal depression (HCC); and Current moderate episode of major depressive disorder without prior episode (HCC) on their problem list.  Health Maintenance:   Immunization History  Administered Date(s) Administered   Influenza Inj Mdck Quad With Preservative 07/27/2018, 08/10/2019, 09/12/2020   Influenza,inj,Quad PF,6+ Mos 10/15/2022   PFIZER(Purple Top)SARS-COV-2 Vaccination 12/21/2019, 01/11/2020   PPD Test 04/14/2014, 04/18/2015, 05/07/2016,  07/01/2017, 07/27/2018, 08/10/2019, 09/12/2020   Td 08/10/2019   Tdap 02/07/2009    Tetanus: 08/2019 Prevnar 20: get age 50 Flu vaccine: Due today  Shingrix: discussed get at pharmacy  Covid 19: 2/2, 2021 pfizer + booster -   Pap: 2024 MGM: Getting annually at GYN - UTD  01/2023 DEXA: start age 15  Colonoscopy: declines at this time, declines cologuard  Last Dental Exam: Out of insurance, last in 2012 Last Eye Exam: 10/2022 yearly - Nor[facility], [place]   Patient Care Team: Tonita Fallow, MD as PCP - General (Internal Medicine)  Surgical History:  She has a past surgical history that includes ORIF ankle fracture (11/05/2012). Family History:  Herfamily history includes Aortic aneurysm in her mother; Brain cancer in her maternal grandfather; CAD in her father; CAD (age of onset: 33) in her mother; COPD in her mother; Diabetes in her father; Heart disease in her maternal grandmother; Heart failure  in her father; Hypertension in her father; Lung cancer (age of onset: 13) in her father. Social History:  She reports that she has never smoked. She has never used smokeless tobacco. She reports current alcohol use of about 3.0 standard drinks of alcohol per week. She reports that she does not use drugs.  Review of Systems: Review of Systems  Constitutional:  Negative for malaise/fatigue and weight loss.  HENT:  Negative for hearing loss and tinnitus.   Eyes:  Negative for blurred vision and double vision.  Respiratory:  Negative for cough, sputum production, shortness of breath and wheezing.   Cardiovascular:  Negative for chest pain, palpitations, orthopnea, claudication, leg swelling and PND.  Gastrointestinal:  Negative for abdominal pain, blood in stool, constipation, diarrhea, heartburn, melena, nausea and vomiting.  Genitourinary: Negative.   Musculoskeletal:  Negative for falls, joint pain and myalgias.  Skin:  Negative for rash.  Neurological:  Negative for dizziness,  tingling, sensory change, weakness and headaches.  Endo/Heme/Allergies:  Negative for polydipsia.  Psychiatric/Behavioral:  Positive for depression (worse with winter season). Negative for memory loss, substance abuse and suicidal ideas. The patient is not nervous/anxious and does not have insomnia.   All other systems reviewed and are negative.   Physical Exam: Estimated body mass index is 22.53 kg/m as calculated from the following:   Height as of this encounter: 5' 6 (1.676 m).   Weight as of this encounter: 139 lb 9.6 oz (63.3 kg). BP 122/82   Pulse 86   Temp 97.9 F (36.6 C)   Ht 5' 6 (1.676 m)   Wt 139 lb 9.6 oz (63.3 kg)   SpO2 99%   BMI 22.53 kg/m  General Appearance: Well nourished, in no apparent distress.  Eyes: PERRLA, EOMs, conjunctiva no swelling or erythema Sinuses: No Frontal/maxillary tenderness  ENT/Mouth: Ext aud canals clear, normal light reflex with TMs without erythema, bulging. Good dentition. No erythema, swelling, or exudate on post pharynx. Tonsils not swollen or erythematous. Hearing normal.  Neck: Supple, thyroid  normal. No bruits  Respiratory: Respiratory effort normal, BS equal bilaterally without rales, rhonchi, wheezing or stridor.  Cardio: RRR without murmurs, rubs or gallops. Brisk peripheral pulses without edema.  Chest: symmetric, with normal excursions and percussion.  Breasts: defer to GYN Abdomen: Soft, nontender, no guarding, rebound, hernias, masses, or organomegaly.  Lymphatics: Non tender without lymphadenopathy.  Genitourinary: defer to GYN Musculoskeletal: Left thumb with mild edema, LROM d/t pain.  Tender to palpation.  Otherwise, FROM in all peripheral extremities,5/5 strength, and normal gait.  Skin: Warm, dry without rashes, lesions, ecchymosis. Neuro: Cranial nerves intact, reflexes equal bilaterally. Normal muscle tone, no cerebellar symptoms. Sensation intact.  Psych: Awake and oriented X 3, depressed affect, Insight and  Judgment appropriate.   EKG: Sinus bradycardia  BASCOM NECESSARY, NP 2:30 PM [facility]

## 2023-10-16 ENCOUNTER — Ambulatory Visit (INDEPENDENT_AMBULATORY_CARE_PROVIDER_SITE_OTHER): Payer: BC Managed Care – PPO | Admitting: Nurse Practitioner

## 2023-10-16 ENCOUNTER — Encounter: Payer: Self-pay | Admitting: Nurse Practitioner

## 2023-10-16 VITALS — BP 122/82 | HR 86 | Temp 97.9°F | Ht 66.0 in | Wt 139.6 lb

## 2023-10-16 DIAGNOSIS — N898 Other specified noninflammatory disorders of vagina: Secondary | ICD-10-CM

## 2023-10-16 DIAGNOSIS — E559 Vitamin D deficiency, unspecified: Secondary | ICD-10-CM | POA: Diagnosis not present

## 2023-10-16 DIAGNOSIS — E538 Deficiency of other specified B group vitamins: Secondary | ICD-10-CM

## 2023-10-16 DIAGNOSIS — Z1389 Encounter for screening for other disorder: Secondary | ICD-10-CM

## 2023-10-16 DIAGNOSIS — E782 Mixed hyperlipidemia: Secondary | ICD-10-CM

## 2023-10-16 DIAGNOSIS — Z79899 Other long term (current) drug therapy: Secondary | ICD-10-CM

## 2023-10-16 DIAGNOSIS — Z13220 Encounter for screening for lipoid disorders: Secondary | ICD-10-CM | POA: Diagnosis not present

## 2023-10-16 DIAGNOSIS — G43709 Chronic migraine without aura, not intractable, without status migrainosus: Secondary | ICD-10-CM

## 2023-10-16 DIAGNOSIS — Z0000 Encounter for general adult medical examination without abnormal findings: Secondary | ICD-10-CM | POA: Diagnosis not present

## 2023-10-16 DIAGNOSIS — Z131 Encounter for screening for diabetes mellitus: Secondary | ICD-10-CM | POA: Diagnosis not present

## 2023-10-16 DIAGNOSIS — F321 Major depressive disorder, single episode, moderate: Secondary | ICD-10-CM

## 2023-10-16 DIAGNOSIS — M79641 Pain in right hand: Secondary | ICD-10-CM

## 2023-10-16 DIAGNOSIS — R5383 Other fatigue: Secondary | ICD-10-CM

## 2023-10-16 DIAGNOSIS — R7309 Other abnormal glucose: Secondary | ICD-10-CM

## 2023-10-16 DIAGNOSIS — G8929 Other chronic pain: Secondary | ICD-10-CM

## 2023-10-16 DIAGNOSIS — Z136 Encounter for screening for cardiovascular disorders: Secondary | ICD-10-CM

## 2023-10-16 DIAGNOSIS — Z6823 Body mass index (BMI) 23.0-23.9, adult: Secondary | ICD-10-CM

## 2023-10-16 DIAGNOSIS — I10 Essential (primary) hypertension: Secondary | ICD-10-CM | POA: Diagnosis not present

## 2023-10-16 DIAGNOSIS — N390 Urinary tract infection, site not specified: Secondary | ICD-10-CM | POA: Diagnosis not present

## 2023-10-16 DIAGNOSIS — Z0001 Encounter for general adult medical examination with abnormal findings: Secondary | ICD-10-CM

## 2023-10-16 MED ORDER — METRONIDAZOLE 500 MG PO TABS: 500.0000 mg | ORAL_TABLET | Freq: Two times a day (BID) | ORAL | 2 refills | Status: AC

## 2023-10-16 MED ORDER — CIPROFLOXACIN HCL 250 MG PO TABS: ORAL_TABLET | ORAL | 0 refills | Status: AC

## 2023-10-16 NOTE — Patient Instructions (Signed)

## 2023-10-17 ENCOUNTER — Encounter: Payer: Self-pay | Admitting: Nurse Practitioner

## 2023-10-17 LAB — COMPLETE METABOLIC PANEL WITH GFR
AG Ratio: 2.6 (calc) — ABNORMAL HIGH (ref 1.0–2.5)
ALT: 14 U/L (ref 6–29)
AST: 21 U/L (ref 10–35)
Albumin: 4.6 g/dL (ref 3.6–5.1)
Alkaline phosphatase (APISO): 64 U/L (ref 37–153)
BUN: 12 mg/dL (ref 7–25)
CO2: 30 mmol/L (ref 20–32)
Calcium: 9.3 mg/dL (ref 8.6–10.4)
Chloride: 101 mmol/L (ref 98–110)
Creat: 0.72 mg/dL (ref 0.50–1.05)
Globulin: 1.8 g/dL — ABNORMAL LOW (ref 1.9–3.7)
Glucose, Bld: 93 mg/dL (ref 65–99)
Potassium: 4 mmol/L (ref 3.5–5.3)
Sodium: 141 mmol/L (ref 135–146)
Total Bilirubin: 0.6 mg/dL (ref 0.2–1.2)
Total Protein: 6.4 g/dL (ref 6.1–8.1)
eGFR: 93 mL/min/{1.73_m2} (ref >=60)

## 2023-10-17 LAB — LIPID PANEL
Cholesterol: 194 mg/dL (ref <200)
HDL: 63 mg/dL (ref >=50)
LDL Cholesterol (Calc): 110 mg/dL — ABNORMAL HIGH
Non-HDL Cholesterol (Calc): 131 mg/dL — ABNORMAL HIGH (ref <130)
Total CHOL/HDL Ratio: 3.1 (calc) (ref <5.0)
Triglycerides: 105 mg/dL (ref <150)

## 2023-10-17 LAB — URINALYSIS, ROUTINE W REFLEX MICROSCOPIC
Bacteria, UA: NONE SEEN /[HPF]
Bilirubin Urine: NEGATIVE
Glucose, UA: NEGATIVE
Hgb urine dipstick: NEGATIVE
Hyaline Cast: NONE SEEN /[LPF]
Ketones, ur: NEGATIVE
Nitrite: NEGATIVE
Protein, ur: NEGATIVE
RBC / HPF: NONE SEEN /[HPF] (ref 0–2)
Specific Gravity, Urine: 1.016 (ref 1.001–1.035)
Squamous Epithelial / HPF: NONE SEEN /[HPF] (ref <=5)
pH: 6 (ref 5.0–8.0)

## 2023-10-17 LAB — URINE CULTURE
MICRO NUMBER:: 15939569
Result:: NO GROWTH
SPECIMEN QUALITY:: ADEQUATE

## 2023-10-17 LAB — CBC WITH DIFFERENTIAL/PLATELET
Absolute Lymphocytes: 1277 {cells}/uL (ref 850–3900)
Absolute Monocytes: 350 {cells}/uL (ref 200–950)
Basophils Absolute: 21 {cells}/uL (ref 0–200)
Basophils Relative: 0.4 %
Eosinophils Absolute: 80 {cells}/uL (ref 15–500)
Eosinophils Relative: 1.5 %
HCT: 43.5 % (ref 35.0–45.0)
Hemoglobin: 15.1 g/dL (ref 11.7–15.5)
MCH: 30.4 pg (ref 27.0–33.0)
MCHC: 34.7 g/dL (ref 32.0–36.0)
MCV: 87.7 fL (ref 80.0–100.0)
MPV: 10.4 fL (ref 7.5–12.5)
Monocytes Relative: 6.6 %
Neutro Abs: 3572 {cells}/uL (ref 1500–7800)
Neutrophils Relative %: 67.4 %
Platelets: 216 10*3/uL (ref 140–400)
RBC: 4.96 10*6/uL (ref 3.80–5.10)
RDW: 12.3 % (ref 11.0–15.0)
Total Lymphocyte: 24.1 %
WBC: 5.3 10*3/uL (ref 3.8–10.8)

## 2023-10-17 LAB — HEMOGLOBIN A1C
Hgb A1c MFr Bld: 5 %{Hb} (ref <5.7)
Mean Plasma Glucose: 97 mg/dL
eAG (mmol/L): 5.4 mmol/L

## 2023-10-17 LAB — MICROALBUMIN / CREATININE URINE RATIO
Creatinine, Urine: 96 mg/dL (ref 20–275)
Microalb Creat Ratio: 4 mg/g{creat} (ref <30)
Microalb, Ur: 0.4 mg/dL

## 2023-10-17 LAB — VITAMIN D 25 HYDROXY (VIT D DEFICIENCY, FRACTURES): Vit D, 25-Hydroxy: 39 ng/mL (ref 30–100)

## 2023-10-17 LAB — MAGNESIUM: Magnesium: 1.9 mg/dL (ref 1.5–2.5)

## 2023-10-17 LAB — TSH: TSH: 1.17 m[IU]/L (ref 0.40–4.50)

## 2023-10-17 LAB — INSULIN, RANDOM: Insulin: 6.1 u[IU]/mL

## 2023-10-20 ENCOUNTER — Other Ambulatory Visit: Payer: Self-pay | Admitting: Nurse Practitioner

## 2023-11-01 ENCOUNTER — Other Ambulatory Visit: Payer: Self-pay | Admitting: Nurse Practitioner

## 2023-11-25 DIAGNOSIS — N760 Acute vaginitis: Secondary | ICD-10-CM | POA: Diagnosis not present

## 2023-11-25 DIAGNOSIS — N898 Other specified noninflammatory disorders of vagina: Secondary | ICD-10-CM | POA: Diagnosis not present

## 2023-11-25 DIAGNOSIS — R300 Dysuria: Secondary | ICD-10-CM | POA: Diagnosis not present

## 2024-01-12 DIAGNOSIS — Z01419 Encounter for gynecological examination (general) (routine) without abnormal findings: Secondary | ICD-10-CM | POA: Diagnosis not present

## 2024-01-12 DIAGNOSIS — Z1231 Encounter for screening mammogram for malignant neoplasm of breast: Secondary | ICD-10-CM | POA: Diagnosis not present

## 2024-01-19 DIAGNOSIS — F411 Generalized anxiety disorder: Secondary | ICD-10-CM | POA: Diagnosis not present

## 2024-01-19 DIAGNOSIS — G43909 Migraine, unspecified, not intractable, without status migrainosus: Secondary | ICD-10-CM | POA: Diagnosis not present

## 2024-01-19 DIAGNOSIS — F329 Major depressive disorder, single episode, unspecified: Secondary | ICD-10-CM | POA: Diagnosis not present

## 2024-01-19 DIAGNOSIS — I10 Essential (primary) hypertension: Secondary | ICD-10-CM | POA: Diagnosis not present

## 2024-03-24 ENCOUNTER — Ambulatory Visit: Payer: BC Managed Care – PPO | Admitting: Nurse Practitioner

## 2024-04-19 DIAGNOSIS — E785 Hyperlipidemia, unspecified: Secondary | ICD-10-CM | POA: Diagnosis not present

## 2024-04-19 DIAGNOSIS — F411 Generalized anxiety disorder: Secondary | ICD-10-CM | POA: Diagnosis not present

## 2024-04-19 DIAGNOSIS — G43909 Migraine, unspecified, not intractable, without status migrainosus: Secondary | ICD-10-CM | POA: Diagnosis not present

## 2024-04-19 DIAGNOSIS — I10 Essential (primary) hypertension: Secondary | ICD-10-CM | POA: Diagnosis not present

## 2024-10-20 ENCOUNTER — Encounter: Payer: BC Managed Care – PPO | Admitting: Nurse Practitioner
